# Patient Record
Sex: Male | Born: 1975 | Race: White | Hispanic: No | Marital: Married | Smoking: Current every day smoker
Health system: Southern US, Community
[De-identification: ages and names within clinical notes are randomized; demographics above are authoritative.]

## PROBLEM LIST (undated history)

## (undated) DIAGNOSIS — S060XAA Concussion with loss of consciousness status unknown, initial encounter: Secondary | ICD-10-CM

## (undated) DIAGNOSIS — S060X9A Concussion with loss of consciousness of unspecified duration, initial encounter: Secondary | ICD-10-CM

## (undated) HISTORY — DX: Concussion with loss of consciousness status unknown, initial encounter: S06.0XAA

## (undated) HISTORY — DX: Concussion with loss of consciousness of unspecified duration, initial encounter: S06.0X9A

---

## 2008-06-21 ENCOUNTER — Emergency Department: Payer: Self-pay | Admitting: Internal Medicine

## 2012-11-15 ENCOUNTER — Emergency Department: Payer: Self-pay | Admitting: Emergency Medicine

## 2014-06-16 ENCOUNTER — Emergency Department: Payer: Self-pay | Admitting: Emergency Medicine

## 2015-04-09 ENCOUNTER — Encounter: Payer: Self-pay | Admitting: Emergency Medicine

## 2015-04-09 ENCOUNTER — Emergency Department: Payer: 59

## 2015-04-09 ENCOUNTER — Other Ambulatory Visit: Payer: Self-pay

## 2015-04-09 ENCOUNTER — Emergency Department
Admission: EM | Admit: 2015-04-09 | Discharge: 2015-04-09 | Disposition: A | Discharge status: Home / Self Care | Payer: 59 | Attending: Emergency Medicine | Admitting: Emergency Medicine

## 2015-04-09 DIAGNOSIS — R51 Headache: Secondary | ICD-10-CM | POA: Insufficient documentation

## 2015-04-09 DIAGNOSIS — K219 Gastro-esophageal reflux disease without esophagitis: Secondary | ICD-10-CM | POA: Diagnosis not present

## 2015-04-09 DIAGNOSIS — R42 Dizziness and giddiness: Secondary | ICD-10-CM | POA: Diagnosis not present

## 2015-04-09 DIAGNOSIS — Z720 Tobacco use: Secondary | ICD-10-CM | POA: Insufficient documentation

## 2015-04-09 DIAGNOSIS — R519 Headache, unspecified: Secondary | ICD-10-CM

## 2015-04-09 LAB — COMPREHENSIVE METABOLIC PANEL
ALBUMIN: 4.3 g/dL (ref 3.5–5.0)
ALT: 23 U/L (ref 17–63)
ANION GAP: 10 (ref 5–15)
AST: 20 U/L (ref 15–41)
Alkaline Phosphatase: 74 U/L (ref 38–126)
BUN: 18 mg/dL (ref 6–20)
CALCIUM: 9.5 mg/dL (ref 8.9–10.3)
CO2: 27 mmol/L (ref 22–32)
CREATININE: 0.85 mg/dL (ref 0.61–1.24)
Chloride: 105 mmol/L (ref 101–111)
GFR calc Af Amer: >60 mL/min (ref >60)
GFR calc non Af Amer: >60 mL/min (ref >60)
Glucose, Bld: 98 mg/dL (ref 65–99)
Potassium: 3.7 mmol/L (ref 3.5–5.1)
Sodium: 142 mmol/L (ref 135–145)
TOTAL PROTEIN: 7.5 g/dL (ref 6.5–8.1)
Total Bilirubin: 0.6 mg/dL (ref 0.3–1.2)

## 2015-04-09 LAB — CBC WITH DIFFERENTIAL/PLATELET
BASOS ABS: 0.1 10*3/uL (ref 0–0.1)
Basophils Relative: 1 %
Eosinophils Absolute: 0.5 10*3/uL (ref 0–0.7)
Eosinophils Relative: 6 %
HCT: 45.3 % (ref 40.0–52.0)
Hemoglobin: 16 g/dL (ref 13.0–18.0)
LYMPHS PCT: 22 %
Lymphs Abs: 1.8 10*3/uL (ref 1.0–3.6)
MCH: 31.6 pg (ref 26.0–34.0)
MCHC: 35.4 g/dL (ref 32.0–36.0)
MCV: 89.5 fL (ref 80.0–100.0)
Monocytes Absolute: 0.5 10*3/uL (ref 0.2–1.0)
Monocytes Relative: 6 %
NEUTROS PCT: 65 %
Neutro Abs: 5.3 10*3/uL (ref 1.4–6.5)
PLATELETS: 190 10*3/uL (ref 150–440)
RBC: 5.07 MIL/uL (ref 4.40–5.90)
RDW: 12.2 % (ref 11.5–14.5)
WBC: 8.2 10*3/uL (ref 3.8–10.6)

## 2015-04-09 LAB — URINALYSIS COMPLETE WITH MICROSCOPIC (ARMC ONLY)
BACTERIA UA: NONE SEEN
Bilirubin Urine: NEGATIVE
GLUCOSE, UA: NEGATIVE mg/dL
HGB URINE DIPSTICK: NEGATIVE
KETONES UR: NEGATIVE mg/dL
Leukocytes, UA: NEGATIVE
NITRITE: NEGATIVE
Protein, ur: NEGATIVE mg/dL
Specific Gravity, Urine: 1.027 (ref 1.005–1.030)
pH: 6 (ref 5.0–8.0)

## 2015-04-09 LAB — TROPONIN I: Troponin I: <0.03 ng/mL (ref <0.031)

## 2015-04-09 MED ORDER — ONDANSETRON 4 MG PO TBDP: 4.0000 mg | ORAL_TABLET | Freq: Once | ORAL | Status: DC

## 2015-04-09 MED ORDER — BUTALBITAL-APAP-CAFFEINE 50-325-40 MG PO TABS: 1.0000 | ORAL_TABLET | Freq: Four times a day (QID) | ORAL | Status: DC | PRN

## 2015-04-09 MED ORDER — ONDANSETRON HCL 4 MG PO TABS: 4.0000 mg | ORAL_TABLET | Freq: Three times a day (TID) | ORAL | Status: DC | PRN

## 2015-04-09 NOTE — Discharge Instructions (Signed)
General Headache Without Cause A headache is pain or discomfort felt around the head or neck area. The specific cause of a headache may not be found. There are many causes and types of headaches. A few common ones are:  Tension headaches.  Migraine headaches.  Cluster headaches.  Chronic daily headaches. HOME CARE INSTRUCTIONS   Keep all follow-up appointments with your caregiver or any specialist referral.  Only take over-the-counter or prescription medicines for pain or discomfort as directed by your caregiver.  Lie down in a dark, quiet room when you have a headache.  Keep a headache journal to find out what may trigger your migraine headaches. For example, write down:  What you eat and drink.  How much sleep you get.  Any change to your diet or medicines.  Try massage or other relaxation techniques.  Put ice packs or heat on the head and neck. Use these 3 to 4 times per day for 15 to 20 minutes each time, or as needed.  Limit stress.  Sit up straight, and do not tense your muscles.  Quit smoking if you smoke.  Limit alcohol use.  Decrease the amount of caffeine you drink, or stop drinking caffeine.  Eat and sleep on a regular schedule.  Get 7 to 9 hours of sleep, or as recommended by your caregiver.  Keep lights dim if bright lights bother you and make your headaches worse. SEEK MEDICAL CARE IF:   You have problems with the medicines you were prescribed.  Your medicines are not working.  You have a change from the usual headache.  You have nausea or vomiting. SEEK IMMEDIATE MEDICAL CARE IF:   Your headache becomes severe.  You have a fever.  You have a stiff neck.  You have loss of vision.  You have muscular weakness or loss of muscle control.  You start losing your balance or have trouble walking.  You feel faint or pass out.  You have severe symptoms that are different from your first symptoms. MAKE SURE YOU:   Understand these  instructions.  Will watch your condition.  Will get help right away if you are not doing well or get worse. Document Released: 11/08/2005 Document Revised: 01/31/2012 Document Reviewed: 11/24/2011 [facility] Patient Information 2015 [place], [place]. This information is not intended to replace advice given to you by your health care provider. Make sure you discuss any questions you have with your health care provider.  Food Choices for Gastroesophageal Reflux Disease When you have gastroesophageal reflux disease (GERD), the foods you eat and your eating habits are very important. Choosing the right foods can help ease the discomfort of GERD. WHAT GENERAL GUIDELINES DO I NEED TO FOLLOW?  Choose fruits, vegetables, whole grains, low-fat dairy products, and low-fat meat, fish, and poultry.  Limit fats such as oils, salad dressings, butter, nuts, and avocado.  Keep a food diary to identify foods that cause symptoms.  Avoid foods that cause reflux. These may be different for different people.  Eat frequent small meals instead of three large meals each day.  Eat your meals slowly, in a relaxed setting.  Limit fried foods.  Cook foods using methods other than frying.  Avoid drinking alcohol.  Avoid drinking large amounts of liquids with your meals.  Avoid bending over or lying down until 2-3 hours after eating. WHAT FOODS ARE NOT RECOMMENDED? The following are some foods and drinks that may worsen your symptoms: Vegetables Tomatoes. Tomato juice. Tomato and spaghetti sauce. Chili peppers.  Onion and garlic. Horseradish. Fruits Oranges, grapefruit, and lemon (fruit and juice). Meats High-fat meats, fish, and poultry. This includes hot dogs, ribs, ham, sausage, salami, and bacon. Dairy Whole milk and chocolate milk. Sour cream. Cream. Butter. Ice cream. Cream cheese.  Beverages Coffee and tea, with or without caffeine. Carbonated beverages or energy drinks. Condiments Hot sauce.  Barbecue sauce.  Sweets/Desserts Chocolate and cocoa. Donuts. Peppermint and spearmint. Fats and Oils High-fat foods, including [place] fries and potato chips. Other Vinegar. Strong spices, such as black pepper, white pepper, red pepper, cayenne, curry powder, cloves, ginger, and chili powder. The items listed above may not be a complete list of foods and beverages to avoid. Contact your dietitian for more information. Document Released: 11/08/2005 Document Revised: 11/13/2013 Document Reviewed: 09/12/2013 [facility] Patient Information 2015 [place], [place]. This information is not intended to replace advice given to you by your health care provider. Make sure you discuss any questions you have with your health care provider.

## 2015-04-09 NOTE — ED Notes (Addendum)
Pt presents to ED with sudden onset of headache and vomiting. Pt states he had fallen asleep on the cough and when he was woken up his head started to hurt. Denies similar symptoms previously. Vomiting X3. Pt currently has clear speech and ambulates with a steady gait. No increased work of breathing or acute distress noted at this time. dx with flu approx 2 weeks ago but had recovered.

## 2015-04-09 NOTE — ED Provider Notes (Signed)
[facility] Emergency Department Provider Note  ____________________________________________  Time seen: Approximately 0545 AM  I have reviewed the triage vital signs and the nursing notes.   HISTORY  Chief Complaint Headache; Emesis; and Dizziness    HPI Rick Mendez is a 39 y.o. male who presents with sudden onset of dull, aching global headache approximately 11 PM. Patient states he had fallen asleep on the couch and woke up to move to his bed when he experienced a 10/10 global headache associated with vomiting 3. Patient denies dizziness, numbness, slurred speech, vision changes, neck pain. Patient denies tick exposure. Patient does say he had flu-like symptoms approximately 2 weeks ago but had recovered last week. Denies recent fever/chills.Attempted to take ibuprofen at home; however, vomited the medicine. States laying supine made his headache worse.   History reviewed. No pertinent past medical history.  Denies history of migraines/frequent headaches  There are no active problems to display for this patient.   History reviewed. No pertinent past surgical history.  Current Outpatient Rx  Name  Route  Sig  Dispense  Refill  . butalbital-acetaminophen-caffeine (FIORICET) 50-325-40 MG per tablet   Oral   Take 1 tablet by mouth every 6 (six) hours as needed for headache.   20 tablet   0   . ondansetron (ZOFRAN) 4 MG tablet   Oral   Take 1 tablet (4 mg total) by mouth every 8 (eight) hours as needed for nausea or vomiting.   20 tablet   1     Allergies Review of patient's allergies indicates no known allergies.  No family history on file.  No history of migraines No history of cerebral aneurysm History of CAD in male members less than 38 years old  Social History History  Substance Use Topics  . Smoking status: Current Every Day Smoker -- 1.00 packs/day    Types: Cigarettes  . Smokeless tobacco: Not on file  . Alcohol Use: No     Review of Systems Constitutional: No fever/chills Eyes: No visual changes. ENT: No sore throat. Cardiovascular: Denies chest pain. Respiratory: Denies shortness of breath. Gastrointestinal: No abdominal pain.  No nausea, no vomiting.  No diarrhea.  No constipation. Complains of almost nightly reflux symptoms radiating into right neck and jaw x several weeks. Genitourinary: Negative for dysuria. Musculoskeletal: Negative for back pain. Skin: Negative for rash. Neurological: Positive for headache. Negative for focal weakness or numbness.  10-point ROS otherwise negative.  ____________________________________________   PHYSICAL EXAM:  VITAL SIGNS: ED Triage Vitals  Enc Vitals Group     BP 04/09/15 0053 123/81 mmHg     Pulse Rate 04/09/15 0053 76     Resp 04/09/15 0620 16     Temp 04/09/15 0053 97.7 F (36.5 C)     Temp Source 04/09/15 0053 Oral     SpO2 04/09/15 0053 97 %     Weight 04/09/15 0053 197 lb (89.359 kg)     Height 04/09/15 0053 5\' 10"  (1.778 m)     Head Cir --      Peak Flow --      Pain Score 04/09/15 0101 4     Pain Loc --      Pain Edu? --      Excl. in GC? --     Constitutional: Alert and oriented. Well appearing and in no acute distress. Eyes: Conjunctivae are normal. PERRL. EOMI. Funduscopy within normal limits. Head: Atraumatic. Nose: No congestion/rhinnorhea. Mouth/Throat: Mucous membranes are moist.  Oropharynx  slightly erythematous with postnasal drip. No tonsillar swelling noted. Neck: No stridor.  No carotid bruit. Supple neck without signs of meningismus. Hematological/Lymphatic/Immunilogical: Shotty right anterior cervical lymphadenopathy. Cardiovascular: Normal rate, regular rhythm. Grossly normal heart sounds.  Good peripheral circulation. Respiratory: Normal respiratory effort.  No retractions. Lungs CTAB. Gastrointestinal: Soft and nontender. No distention. No abdominal bruits. No CVA tenderness. Musculoskeletal: No lower extremity  tenderness nor edema.  No joint effusions. Neurologic:  Normal speech and language. No gross focal neurologic deficits are appreciated. Speech is normal. No gait instability. Skin:  Skin is warm, dry and intact. No rash noted. No petechiae. Psychiatric: Mood and affect are normal. Speech and behavior are normal.  ____________________________________________   LABS (all labs ordered are listed, but only abnormal results are displayed)  Labs Reviewed  URINALYSIS COMPLETEWITH MICROSCOPIC ([facility])  - Abnormal; Notable for the following:    Color, Urine YELLOW (*)    APPearance CLEAR (*)    Squamous Epithelial / LPF 0-5 (*)    All other components within normal limits  CBC WITH DIFFERENTIAL/PLATELET  COMPREHENSIVE METABOLIC PANEL  TROPONIN I   ____________________________________________  EKG  ED ECG REPORT   Date: 04/09/2015  EKG Time: 0557  Rate: 60  Rhythm: normal EKG, normal sinus rhythm  Axis: Normal  Intervals:none  ST&T Change: Nonspecific  ____________________________________________  RADIOLOGY  CT head without contrast interpreted per Dr. Cherly Hensenhang:  Unremarkable noncontrast CT of the head. ____________________________________________   PROCEDURES  Procedure(s) performed: None  Critical Care performed: No  ____________________________________________   INITIAL IMPRESSION / ASSESSMENT AND PLAN / ED COURSE  Pertinent labs & imaging results that were available during my care of the patient were reviewed by me and considered in my medical decision making (see chart for details).  39 year old male presents with sudden onset of global headache prior to arrival. Headache resolved without intervention while patient was in waiting room. Currently voices no complaints. Supple neck; no focal neurologic deficits noted. Discussed with patient and family low suspicion of subarachnoid hemorrhage given that headache resolved without intervention and patient is resting  comfortably without neurologic deficits with supple neck. Spouse concerned symptoms of headache and several week history of reflux-like symptoms may be cardiac in nature, especially given patient's strong family history of CAD. Will obtain EKG and check troponin.  0630: Patient resting in no acute distress. Headache has not recurred. No complaints of reflux, chest pain, jaw pain symptoms. Will refer to cardiology for further workup. PRN Fioricet and Zofran; also close follow-up with PCP. Discussed with patient and spouse and given strict return precautions. Both verbalize understanding and agree with plan of care. ____________________________________________   FINAL CLINICAL IMPRESSION(S) / ED DIAGNOSES  Final diagnoses:  Acute nonintractable headache, unspecified headache type  Gastroesophageal reflux disease without esophagitis      Irean HongJade J Danitza Schoenfeldt, MD 04/09/15 [phone]

## 2015-08-20 ENCOUNTER — Telehealth: Payer: Self-pay | Admitting: Unknown Physician Specialty

## 2015-08-20 MED ORDER — TADALAFIL 20 MG PO TABS: 20.0000 mg | ORAL_TABLET | Freq: Every day | ORAL | Status: DC | PRN

## 2015-08-20 NOTE — Telephone Encounter (Signed)
Patient was last seen 03/26/15, practice partner number is [phone], and pharmacy is [facility] on [address].

## 2015-08-20 NOTE — Telephone Encounter (Signed)
Pt would like a refill for cialis to go to [facility]

## 2015-08-20 NOTE — Telephone Encounter (Signed)
Called to let patient know that Elnita Maxwell refilled his medication. But while Elnita Maxwell and I were looking in Garment/textile technologist, we noticed that the patient is due for his physical so I tried to call him to see if he wanted to schedule but he did not answer. I asked for him to please return my call to schedule a physical.

## 2015-08-21 NOTE — Telephone Encounter (Signed)
Patient returned call and scheduled CPE for 09/23/15.

## 2015-09-16 DIAGNOSIS — F17220 Nicotine dependence, chewing tobacco, uncomplicated: Secondary | ICD-10-CM | POA: Insufficient documentation

## 2015-09-16 DIAGNOSIS — N529 Male erectile dysfunction, unspecified: Secondary | ICD-10-CM

## 2015-09-16 DIAGNOSIS — F17200 Nicotine dependence, unspecified, uncomplicated: Secondary | ICD-10-CM

## 2015-09-23 ENCOUNTER — Encounter: Payer: Self-pay | Admitting: Unknown Physician Specialty

## 2015-10-01 ENCOUNTER — Ambulatory Visit (INDEPENDENT_AMBULATORY_CARE_PROVIDER_SITE_OTHER): Payer: 59 | Admitting: Unknown Physician Specialty

## 2015-10-01 ENCOUNTER — Encounter: Payer: Self-pay | Admitting: Unknown Physician Specialty

## 2015-10-01 VITALS — BP 139/85 | HR 79 | Temp 98.4°F | Ht 69.1 in | Wt 199.8 lb

## 2015-10-01 DIAGNOSIS — K219 Gastro-esophageal reflux disease without esophagitis: Secondary | ICD-10-CM | POA: Insufficient documentation

## 2015-10-01 DIAGNOSIS — Z0000 Encounter for general adult medical examination without abnormal findings: Secondary | ICD-10-CM

## 2015-10-01 DIAGNOSIS — F17200 Nicotine dependence, unspecified, uncomplicated: Secondary | ICD-10-CM

## 2015-10-01 MED ORDER — OMEPRAZOLE 20 MG PO CPDR: 20.0000 mg | DELAYED_RELEASE_CAPSULE | Freq: Every day | ORAL | Status: DC

## 2015-10-01 NOTE — Patient Instructions (Signed)

## 2015-10-01 NOTE — Progress Notes (Signed)
   BP 139/85 mmHg  Pulse 79  Temp(Src) 98.4 F (36.9 C)  Ht 5' 9.1" (1.755 m)  Wt 199 lb 12.8 oz (90.629 kg)  BMI 29.42 kg/m2  SpO2 99%   Subjective:    Patient ID: Rick Mendez, male    DOB: [birth year], 39 y.o.   MRN: [id number]  HPI: Rick Hartiganubrey L Guardia is a 39 y.o. male  Chief Complaint  Patient presents with  . Annual Exam   GERD Burning sensation in chest with anything he eats.  Taking Zantac which makes it better.  States everything bothers it and bothers hem at night.  States he woke up with it the other day.  He was in the ER for chest pain in May with a negative cardiac work up.    Tobacco He understands the contribution to GERD.  Not ready to quit at this time.    Relevant past medical, surgical, family and social history reviewed and updated as indicated. Interim medical history since our last visit reviewed. Allergies and medications reviewed and updated.  Review of Systems  Constitutional: Negative.   HENT: Negative.   Eyes: Negative.   Respiratory: Negative.   Cardiovascular: Negative.   Gastrointestinal: Positive for nausea and abdominal pain.  Endocrine: Negative.   Genitourinary: Negative.   Skin: Negative.   Allergic/Immunologic: Negative.   Neurological: Negative.   Hematological: Negative.   Psychiatric/Behavioral: Negative.     Per HPI unless specifically indicated above     Objective:    BP 139/85 mmHg  Pulse 79  Temp(Src) 98.4 F (36.9 C)  Ht 5' 9.1" (1.755 m)  Wt 199 lb 12.8 oz (90.629 kg)  BMI 29.42 kg/m2  SpO2 99%  Wt Readings from Last 3 Encounters:  10/01/15 199 lb 12.8 oz (90.629 kg)  03/26/15 201 lb (91.173 kg)  04/09/15 197 lb (89.359 kg)    Physical Exam  Constitutional: He is oriented to person, place, and time. He appears well-developed and well-nourished.  HENT:  Head: Normocephalic.  Eyes: Pupils are equal, round, and reactive to light.  Cardiovascular: Normal rate, regular rhythm and normal heart sounds.    Pulmonary/Chest: Effort normal.  Abdominal: Soft. Bowel sounds are normal.  Musculoskeletal: Normal range of motion.  Neurological: He is alert and oriented to person, place, and time. He has normal reflexes.  Skin: Skin is warm and dry.  Psychiatric: He has a normal mood and affect. His behavior is normal. Judgment and thought content normal.      Assessment & Plan:   Problem List Items Addressed This Visit      Unprioritized   Tobacco use disorder    Encouraged to quit      GERD (gastroesophageal reflux disease)    Start Omeprazole.  Risks discussed      Relevant Medications   omeprazole (PRILOSEC) 20 MG capsule    Other Visit Diagnoses    Annual physical exam    -  Primary        Follow up plan: No Follow-up on file.

## 2015-10-01 NOTE — Assessment & Plan Note (Signed)
Start Omeprazole.  Risks discussed

## 2015-10-01 NOTE — Assessment & Plan Note (Signed)
Encouraged to quit. 

## 2015-10-02 ENCOUNTER — Encounter: Payer: Self-pay | Admitting: Unknown Physician Specialty

## 2015-10-02 LAB — COMPREHENSIVE METABOLIC PANEL
ALT: 21 IU/L (ref 0–44)
AST: 21 IU/L (ref 0–40)
Albumin/Globulin Ratio: 1.8 (ref 1.1–2.5)
Albumin: 4.8 g/dL (ref 3.5–5.5)
Alkaline Phosphatase: 88 IU/L (ref 39–117)
BUN/Creatinine Ratio: 22 — ABNORMAL HIGH (ref 8–19)
BUN: 19 mg/dL (ref 6–20)
Bilirubin Total: 0.3 mg/dL (ref 0.0–1.2)
CALCIUM: 9.8 mg/dL (ref 8.7–10.2)
CO2: 23 mmol/L (ref 18–29)
CREATININE: 0.86 mg/dL (ref 0.76–1.27)
Chloride: 98 mmol/L (ref 97–106)
GFR calc Af Amer: 128 mL/min/{1.73_m2} (ref >59)
GFR, EST NON AFRICAN AMERICAN: 111 mL/min/{1.73_m2} (ref >59)
Globulin, Total: 2.6 g/dL (ref 1.5–4.5)
Glucose: 75 mg/dL (ref 65–99)
Potassium: 4.2 mmol/L (ref 3.5–5.2)
Sodium: 139 mmol/L (ref 136–144)
Total Protein: 7.4 g/dL (ref 6.0–8.5)

## 2015-10-02 LAB — CBC
Hematocrit: 45.1 % (ref 37.5–51.0)
Hemoglobin: 15.9 g/dL (ref 12.6–17.7)
MCH: 32.4 pg (ref 26.6–33.0)
MCHC: 35.3 g/dL (ref 31.5–35.7)
MCV: 92 fL (ref 79–97)
PLATELETS: 195 10*3/uL (ref 150–379)
RBC: 4.9 x10E6/uL (ref 4.14–5.80)
RDW: 12.8 % (ref 12.3–15.4)
WBC: 7 10*3/uL (ref 3.4–10.8)

## 2015-10-02 LAB — HIV ANTIBODY (ROUTINE TESTING W REFLEX): HIV Screen 4th Generation wRfx: NONREACTIVE

## 2015-10-02 LAB — LIPID PANEL W/O CHOL/HDL RATIO
Cholesterol, Total: 168 mg/dL (ref 100–199)
HDL: 32 mg/dL — ABNORMAL LOW (ref >39)
LDL CALC: 110 mg/dL — AB (ref 0–99)
Triglycerides: 129 mg/dL (ref 0–149)
VLDL CHOLESTEROL CAL: 26 mg/dL (ref 5–40)

## 2015-10-02 LAB — PSA: PROSTATE SPECIFIC AG, SERUM: 0.7 ng/mL (ref 0.0–4.0)

## 2015-10-02 LAB — TSH: TSH: 2.23 u[IU]/mL (ref 0.450–4.500)

## 2015-10-10 ENCOUNTER — Emergency Department: Payer: 59

## 2015-10-10 ENCOUNTER — Encounter: Payer: Self-pay | Admitting: Emergency Medicine

## 2015-10-10 ENCOUNTER — Emergency Department
Admission: EM | Admit: 2015-10-10 | Discharge: 2015-10-10 | Disposition: A | Discharge status: Home / Self Care | Payer: 59 | Attending: Emergency Medicine | Admitting: Emergency Medicine

## 2015-10-10 DIAGNOSIS — S93402A Sprain of unspecified ligament of left ankle, initial encounter: Secondary | ICD-10-CM | POA: Diagnosis not present

## 2015-10-10 DIAGNOSIS — F17210 Nicotine dependence, cigarettes, uncomplicated: Secondary | ICD-10-CM | POA: Diagnosis not present

## 2015-10-10 DIAGNOSIS — Y9301 Activity, walking, marching and hiking: Secondary | ICD-10-CM | POA: Diagnosis not present

## 2015-10-10 DIAGNOSIS — Y998 Other external cause status: Secondary | ICD-10-CM | POA: Diagnosis not present

## 2015-10-10 DIAGNOSIS — Z79899 Other long term (current) drug therapy: Secondary | ICD-10-CM | POA: Diagnosis not present

## 2015-10-10 DIAGNOSIS — Y9289 Other specified places as the place of occurrence of the external cause: Secondary | ICD-10-CM | POA: Insufficient documentation

## 2015-10-10 DIAGNOSIS — X58XXXA Exposure to other specified factors, initial encounter: Secondary | ICD-10-CM | POA: Diagnosis not present

## 2015-10-10 DIAGNOSIS — S99912A Unspecified injury of left ankle, initial encounter: Secondary | ICD-10-CM | POA: Diagnosis present

## 2015-10-10 MED ORDER — TRAMADOL HCL 50 MG PO TABS: 50.0000 mg | ORAL_TABLET | Freq: Four times a day (QID) | ORAL | Status: DC | PRN

## 2015-10-10 MED ORDER — IBUPROFEN 800 MG PO TABS: 800.0000 mg | ORAL_TABLET | Freq: Three times a day (TID) | ORAL | Status: DC | PRN

## 2015-10-10 NOTE — Discharge Instructions (Signed)
Ankle Sprain °An ankle sprain is an injury to the strong, fibrous tissues (ligaments) that hold the bones of your ankle joint together.  °CAUSES °An ankle sprain is usually caused by a fall or by twisting your ankle. Ankle sprains most commonly occur when you step on the outer edge of your foot, and your ankle turns inward. People who participate in sports are more prone to these types of injuries.  °SYMPTOMS  °· Pain in your ankle. The pain may be present at rest or only when you are trying to stand or walk. °· Swelling. °· Bruising. Bruising may develop immediately or within 1 to 2 days after your injury. °· Difficulty standing or walking, particularly when turning corners or changing directions. °DIAGNOSIS  °Your caregiver will ask you details about your injury and perform a physical exam of your ankle to determine if you have an ankle sprain. During the physical exam, your caregiver will press on and apply pressure to specific areas of your foot and ankle. Your caregiver will try to move your ankle in certain ways. An X-ray exam may be done to be sure a bone was not broken or a ligament did not separate from one of the bones in your ankle (avulsion fracture).  °TREATMENT  °Certain types of braces can help stabilize your ankle. Your caregiver can make a recommendation for this. Your caregiver may recommend the use of medicine for pain. If your sprain is severe, your caregiver may refer you to a surgeon who helps to restore function to parts of your skeletal system (orthopedist) or a physical therapist. °HOME CARE INSTRUCTIONS  °· Apply ice to your injury for 1-2 days or as directed by your caregiver. Applying ice helps to reduce inflammation and pain. °· Put ice in a plastic bag. °· Place a towel between your skin and the bag. °· Leave the ice on for 15-20 minutes at a time, every 2 hours while you are awake. °· Only take over-the-counter or prescription medicines for pain, discomfort, or fever as directed by  your caregiver. °· Elevate your injured ankle above the level of your heart as much as possible for 2-3 days. °· If your caregiver recommends crutches, use them as instructed. Gradually put weight on the affected ankle. Continue to use crutches or a cane until you can walk without feeling pain in your ankle. °· If you have a plaster splint, wear the splint as directed by your caregiver. Do not rest it on anything harder than a pillow for the first 24 hours. Do not put weight on it. Do not get it wet. You may take it off to take a shower or bath. °· You may have been given an elastic bandage to wear around your ankle to provide support. If the elastic bandage is too tight (you have numbness or tingling in your foot or your foot becomes cold and blue), adjust the bandage to make it comfortable. °· If you have an air splint, you may blow more air into it or let air out to make it more comfortable. You may take your splint off at night and before taking a shower or bath. Wiggle your toes in the splint several times per day to decrease swelling. °SEEK MEDICAL CARE IF:  °· You have rapidly increasing bruising or swelling. °· Your toes feel extremely cold or you lose feeling in your foot. °· Your pain is not relieved with medicine. °SEEK IMMEDIATE MEDICAL CARE IF: °· Your toes are numb or blue. °·   You have severe pain that is increasing. °MAKE SURE YOU:  °· Understand these instructions. °· Will watch your condition. °· Will get help right away if you are not doing well or get worse. °  °This information is not intended to replace advice given to you by your health care provider. Make sure you discuss any questions you have with your health care provider. °  °Document Released: 11/08/2005 Document Revised: 11/29/2014 Document Reviewed: 11/20/2011 °Elsevier Interactive Patient Education ©2016 [employer]. ° °Cryotherapy °Cryotherapy means treatment with cold. Ice or gel packs can be used to reduce both pain and swelling.  Ice is the most helpful within the first 24 to 48 hours after an injury or flare-up from overusing a muscle or joint. Sprains, strains, spasms, burning pain, shooting pain, and aches can all be eased with ice. Ice can also be used when recovering from surgery. Ice is effective, has very few side effects, and is safe for most people to use. °PRECAUTIONS  °Ice is not a safe treatment option for people with: °· Raynaud phenomenon. This is a condition affecting small blood vessels in the extremities. Exposure to cold may cause your problems to return. °· Cold hypersensitivity. There are many forms of cold hypersensitivity, including: °¨ Cold urticaria. Red, itchy hives appear on the skin when the tissues begin to warm after being iced. °¨ Cold erythema. This is a red, itchy rash caused by exposure to cold. °¨ Cold hemoglobinuria. Red blood cells break down when the tissues begin to warm after being iced. The hemoglobin that carry oxygen are passed into the urine because they cannot combine with blood proteins fast enough. °· Numbness or altered sensitivity in the area being iced. °If you have any of the following conditions, do not use ice until you have discussed cryotherapy with your caregiver: °· Heart conditions, such as arrhythmia, angina, or chronic heart disease. °· High blood pressure. °· Healing wounds or open skin in the area being iced. °· Current infections. °· Rheumatoid arthritis. °· Poor circulation. °· Diabetes. °Ice slows the blood flow in the region it is applied. This is beneficial when trying to stop inflamed tissues from spreading irritating chemicals to surrounding tissues. However, if you expose your skin to cold temperatures for too long or without the proper protection, you can damage your skin or nerves. Watch for signs of skin damage due to cold. °HOME CARE INSTRUCTIONS °Follow these tips to use ice and cold packs safely. °· Place a dry or damp towel between the ice and skin. A damp towel will  cool the skin more quickly, so you may need to shorten the time that the ice is used. °· For a more rapid response, add gentle compression to the ice. °· Ice for no more than 10 to 20 minutes at a time. The bonier the area you are icing, the less time it will take to get the benefits of ice. °· Check your skin after 5 minutes to make sure there are no signs of a poor response to cold or skin damage. °· Rest 20 minutes or more between uses. °· Once your skin is numb, you can end your treatment. You can test numbness by very lightly touching your skin. The touch should be so light that you do not see the skin dimple from the pressure of your fingertip. When using ice, most people will feel these normal sensations in this order: cold, burning, aching, and numbness. °· Do not use ice on someone who   cannot communicate their responses to pain, such as small children or people with dementia. °HOW TO MAKE AN ICE PACK °Ice packs are the most common way to use ice therapy. Other methods include ice massage, ice baths, and cryosprays. Muscle creams that cause a cold, tingly feeling do not offer the same benefits that ice offers and should not be used as a substitute unless recommended by your caregiver. °To make an ice pack, do one of the following: °· Place crushed ice or a bag of frozen vegetables in a sealable plastic bag. Squeeze out the excess air. Place this bag inside another plastic bag. Slide the bag into a pillowcase or place a damp towel between your skin and the bag. °· Mix 3 parts water with 1 part rubbing alcohol. Freeze the mixture in a sealable plastic bag. When you remove the mixture from the freezer, it will be slushy. Squeeze out the excess air. Place this bag inside another plastic bag. Slide the bag into a pillowcase or place a damp towel between your skin and the bag. °SEEK MEDICAL CARE IF: °· You develop white spots on your skin. This may give the skin a blotchy (mottled) appearance. °· Your skin turns  blue or pale. °· Your skin becomes waxy or hard. °· Your swelling gets worse. °MAKE SURE YOU:  °· Understand these instructions. °· Will watch your condition. °· Will get help right away if you are not doing well or get worse. °  °This information is not intended to replace advice given to you by your health care provider. Make sure you discuss any questions you have with your health care provider. °  °Document Released: 07/05/2011 Document Revised: 11/29/2014 Document Reviewed: 07/05/2011 °Elsevier Interactive Patient Education ©2016 [employer]. ° °

## 2015-10-10 NOTE — ED Provider Notes (Signed)
CSN: [id number]     Arrival date & time 10/10/15  2058 History   First MD Initiated Contact with Patient 10/10/15 2123     Chief Complaint  Patient presents with  . Ankle Pain    left     (Consider location/radiation/quality/duration/timing/severity/associated sxs/prior Treatment) HPI  39 year old male presents to emergency department for evaluation of left ankle pain. Patient was walking on a curb just prior to arrival when he inverted and rolled his left ankle. Patient has pain swelling throughout the left lateral ankle. There is no medial ankle pain. He felt a pop along the lateral ankle. He has had 800 mg of ibuprofen. His pain is mild. He describes the pain as a constant throbbing. He is unable to bear weight. He denies any other injuries to his body.  Past Medical History  Diagnosis Date  . Concussion    History reviewed. No pertinent past surgical history. Family History  Problem Relation Age of Onset  . Diabetes Father   . Mental illness Father   . Dementia Maternal Grandmother   . Heart disease Maternal Grandfather     MI  . Cancer Paternal Grandfather     prostate   Social History  Substance Use Topics  . Smoking status: Current Every Day Smoker -- 1.50 packs/day for 19 years    Types: Cigarettes  . Smokeless tobacco: Never Used  . Alcohol Use: Yes     Comment: social drinker    Review of Systems  Constitutional: Negative.   Cardiovascular: Negative for chest pain and leg swelling.  Gastrointestinal: Negative for abdominal pain.  Musculoskeletal: Positive for joint swelling and gait problem. Negative for back pain and neck pain.  Skin: Negative for color change, rash and wound.  Neurological: Negative for dizziness, syncope and weakness.  Psychiatric/Behavioral: Negative for hallucinations and confusion.  All other systems reviewed and are negative.     Allergies  Review of patient's allergies indicates no known allergies.  Home Medications   Prior  to Admission medications   Medication Sig Start Date End Date Taking? Authorizing Provider  ibuprofen (ADVIL,MOTRIN) 800 MG tablet Take 1 tablet (800 mg total) by mouth every 8 (eight) hours as needed. 10/10/15   Evon Slack, PA-C  omeprazole (PRILOSEC) 20 MG capsule Take 1 capsule (20 mg total) by mouth daily. 10/01/15   Gabriel Cirri, NP  tadalafil (CIALIS) 20 MG tablet Take 1 tablet (20 mg total) by mouth daily as needed for erectile dysfunction. 08/20/15   Gabriel Cirri, NP  traMADol (ULTRAM) 50 MG tablet Take 1 tablet (50 mg total) by mouth every 6 (six) hours as needed. 10/10/15   Evon Slack, PA-C   BP 129/70 mmHg  Pulse 114  Temp(Src) 98.1 F (36.7 C) (Oral)  Resp 18  Ht  (1.803 m)  Wt 195 lb (88.451 kg)  BMI 27.21 kg/m2  SpO2 95% Physical Exam  Constitutional: He is oriented to person, place, and time. He appears well-developed and well-nourished.  HENT:  Head: Normocephalic and atraumatic.  Eyes: Conjunctivae and EOM are normal. Pupils are equal, round, and reactive to light.  Neck: Normal range of motion. Neck supple.  Cardiovascular: Normal rate and intact distal pulses.   Pulmonary/Chest: Effort normal. No respiratory distress.  Musculoskeletal:  Examination of the left lower strip shows patient has full range of motion of the hip and knee. He has lived range of motion of the ankle. There is significant lateral ankle soft tissue swelling. He is tender over  the ATFL ligament. No fifth metatarsal discomfort. No medial malleolus tenderness. No gross deformity. His neurovascularly intact in left lower extremity.  Neurological: He is alert and oriented to person, place, and time.  Skin: Skin is warm and dry.  Psychiatric: He has a normal mood and affect. His behavior is normal. Judgment and thought content normal.    ED Course  Procedures (including critical care time) SPLINT APPLICATION Date/Time: 9:44 PM Authorized by: Patience MuscaGAINES, Avyonna Wagoner CHRISTOPHER Consent:  Verbal consent obtained. Risks and benefits: risks, benefits and alternatives were discussed Consent given by: patient Splint applied [id number] Location details: left ankle Splint type: ankle stirrup Supplies used: ankle stirrup Post-procedure: The splinted body part was neurovascularly unchanged following the procedure. Patient tolerance: Patient tolerated the procedure well with no immediate complications.    Labs Review Labs Reviewed - No data to display  Imaging Review No results found. I have personally reviewed and evaluated these images and lab results as part of my medical decision-making. AP lateral and oblique views of the left ankle were ordered and interpreted by me in the office today. Impression: Ankle mortise intact, there is no evidence of acute bony abnormality. There is visible soft tissue swelling.   EKG Interpretation None      MDM   Final diagnoses:  Ankle sprain, right, initial encounter    39 year old male with left lateral ankle sprain of the ATFL ligament. X-rays negative for any acute bony abnormality. He is placed into a ankle splint. He will rest ice and elevate. He is given crutches to help with ambulation. Ibuprofen when necessary pain. Follow-up with orthopedics if no improvement in 5-7 days.    Evon Slackhomas C Nalu Troublefield, PA-C 10/10/15 2142  Evon Slackhomas C Kongmeng Santoro, PA-C 10/10/15 2144  Governor Rooksebecca Lord, MD 10/11/15 [phone]

## 2015-10-10 NOTE — ED Notes (Signed)
Pt was discharged by Penni BombardKendall RN

## 2015-10-10 NOTE — ED Notes (Signed)
Patient states that he stepped off a curb and twisted his left ankle. Patient states that he heard cracking. Patient with swelling to left ankle.

## 2016-02-09 IMAGING — CT CT HEAD W/O CM
1 series · 16 of 30 positions shown, 20 images · non-contrast
Comparison: CT of the head performed 06/17/2014

CLINICAL DATA: Acute onset of headache and vomiting. Initial
encounter.

EXAM:
CT HEAD WITHOUT CONTRAST
TECHNIQUE: Contiguous axial images were obtained from the base of the skull
through the vertex without intravenous contrast.

[Series 2: head wo · axial · 0.41mm/px · z∈[-126,+18]mm · 16 of 36 slices shown, 20 images]
[im 2/36  brain]
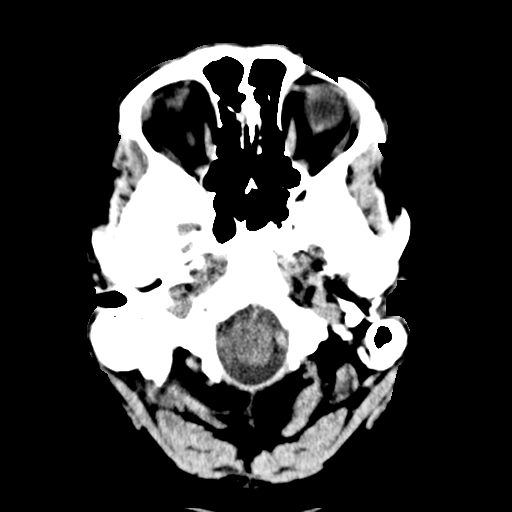
[im 2/36  bone]
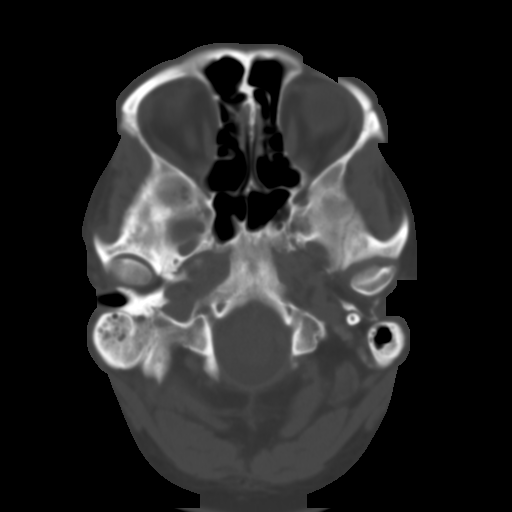
[im 4/36  brain]
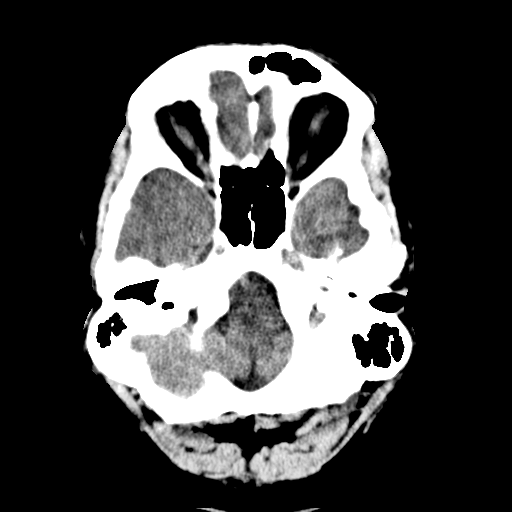
[im 7/36  brain]
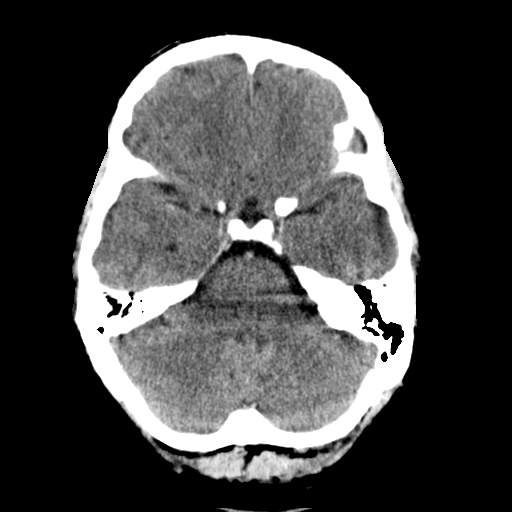
[im 9/36  brain]
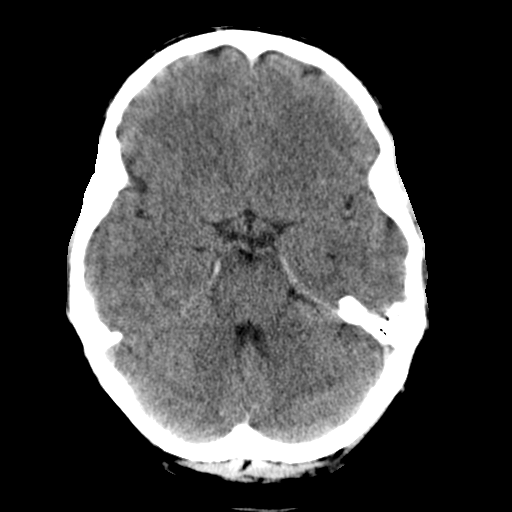
[im 10/36  brain]
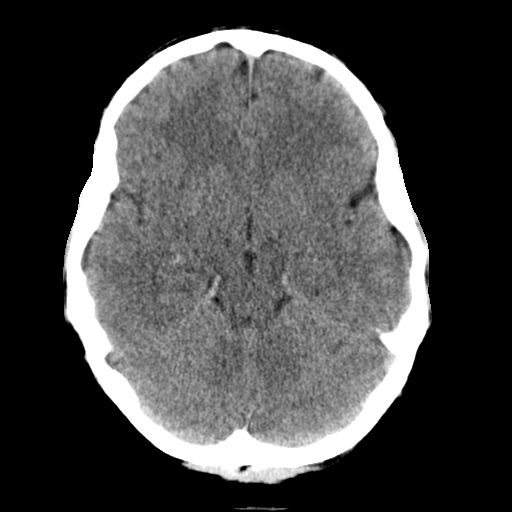
[im 10/36  bone]
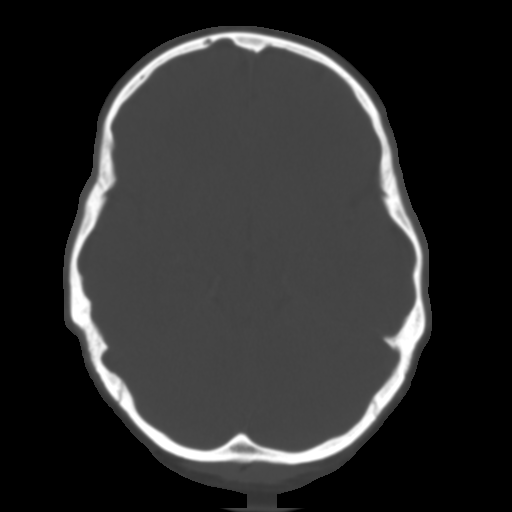
[im 13/36  brain]
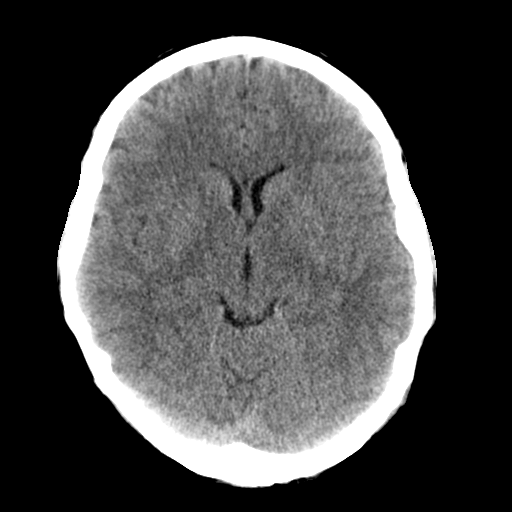
[im 15/36  brain]
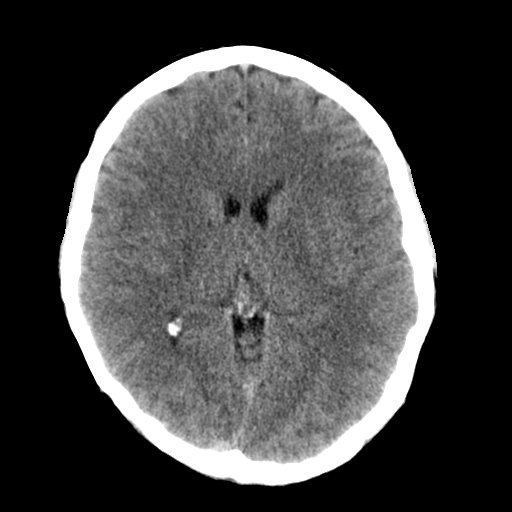
[im 17/36  brain]
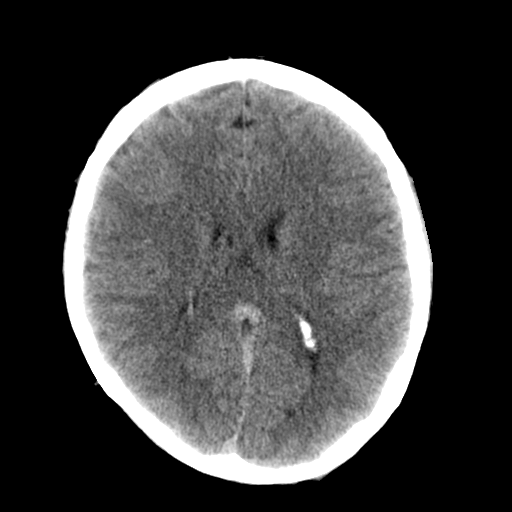
[im 19/36  brain]
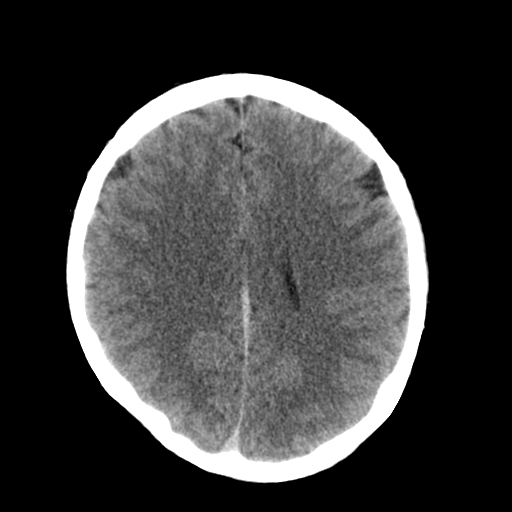
[im 19/36  bone]
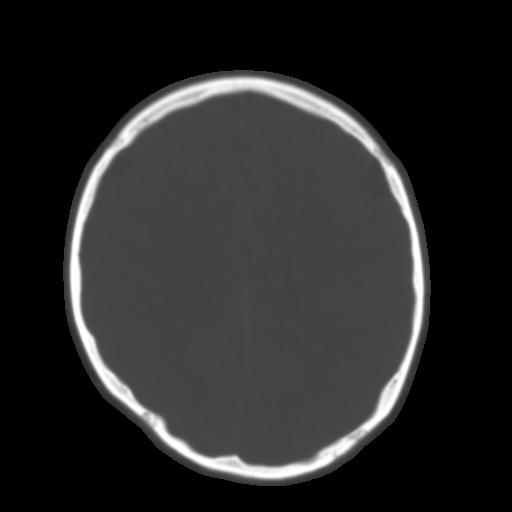
[im 21/36  brain]
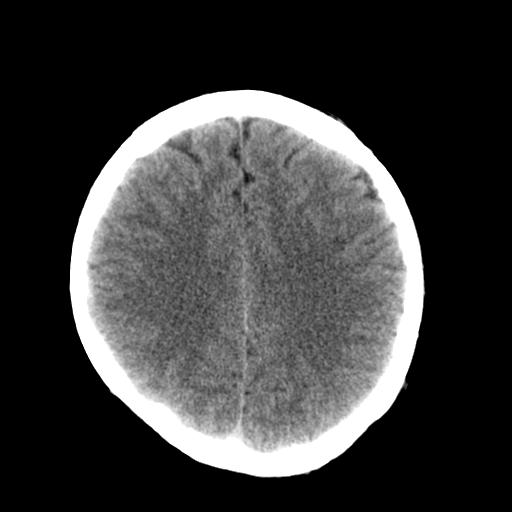
[im 23/36  brain]
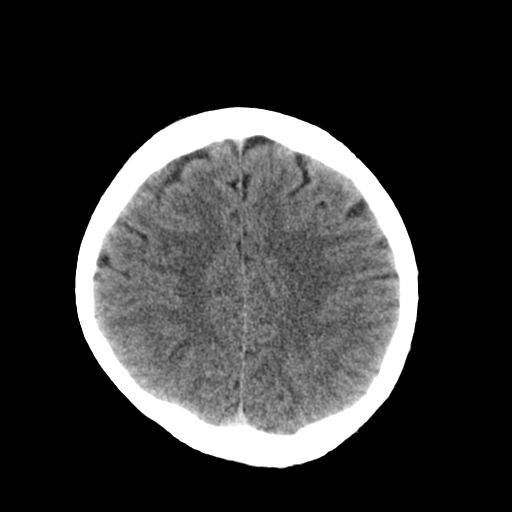
[im 26/36  brain]
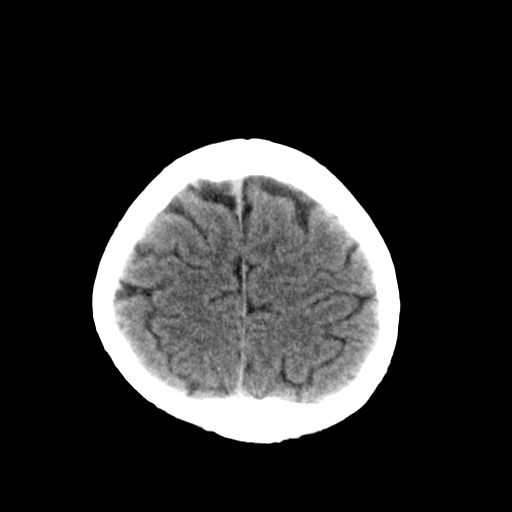
[im 27/36  brain]
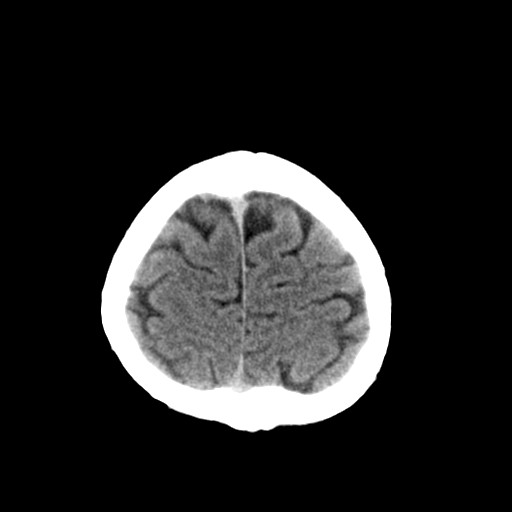
[im 27/36  bone]
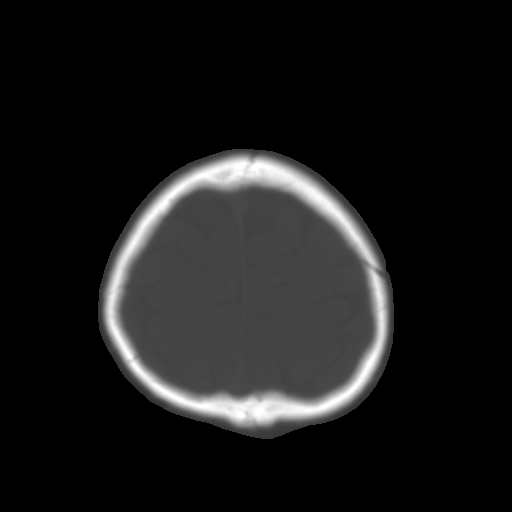
[im 29/36  brain]
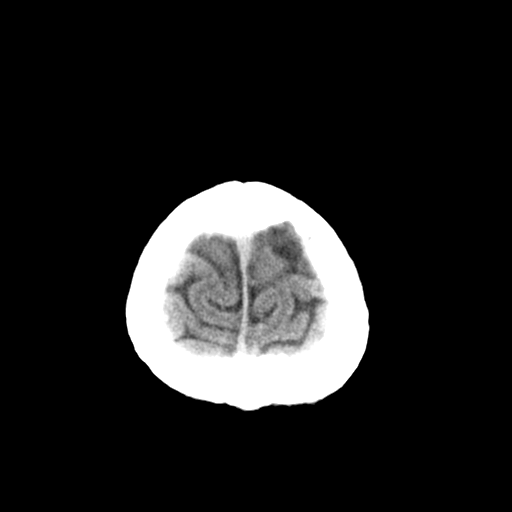
[im 32/36  brain]
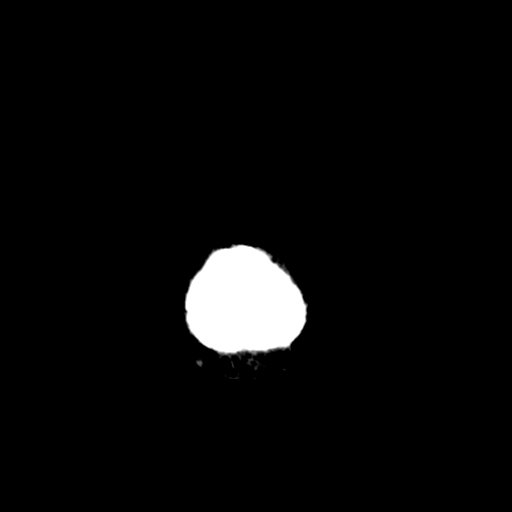
[im 34/36  brain]
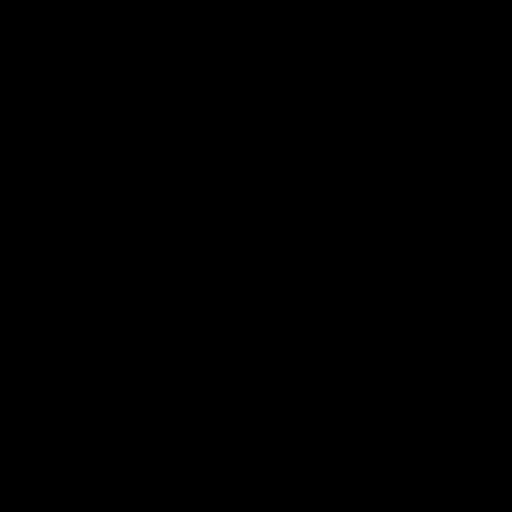

[16 of 30 positions shown; findings below may reference images not displayed]

FINDINGS: There is no evidence of acute infarction, mass lesion, or intra- or
extra-axial hemorrhage on CT.

The posterior fossa, including the cerebellum, brainstem and fourth
ventricle, is within normal limits. The third and lateral
ventricles, and basal ganglia are unremarkable in appearance. The
cerebral hemispheres are symmetric in appearance, with normal
gray-white differentiation. No mass effect or midline shift is seen.

There is no evidence of fracture; visualized osseous structures are
unremarkable in appearance. The visualized portions of the orbits
are within normal limits. The paranasal sinuses and mastoid air
cells are well-aerated. No significant soft tissue abnormalities are
seen.
IMPRESSION: Unremarkable noncontrast CT of the head.

## 2016-07-22 ENCOUNTER — Telehealth: Payer: Self-pay | Admitting: Unknown Physician Specialty

## 2016-07-22 NOTE — Telephone Encounter (Signed)
LMOM to call back to schedule an appt.

## 2016-07-22 NOTE — Telephone Encounter (Signed)
According to chart, this medication was given to patient in May of 2016 for a short amount of time. Would patient needed an appointment for more refills?

## 2016-07-22 NOTE — Telephone Encounter (Signed)
Pt stated he would call back to schedule an appt

## 2016-07-22 NOTE — Telephone Encounter (Signed)
Needs appt, thanks

## 2016-07-22 NOTE — Telephone Encounter (Signed)
Pt's wife called stated pt needs refill on RX that was prescribed to him for migraines during an ER visit. Medication is Fioricet. [facility] [place]. [facility]. Thanks.

## 2016-07-22 NOTE — Telephone Encounter (Signed)
Christan, will you schedule an appointment for this patient?

## 2016-08-11 IMAGING — CR DG ANKLE COMPLETE 3+V*L*
1 series · 3 of 3 positions shown · non-contrast
Comparison: None.

CLINICAL DATA: Patient stepped off curb this evening into a drain
and rolled his left ankle. His pain is on the lateral side of his
ankle with swelling.

EXAM:
LEFT ANKLE COMPLETE - 3+ VIEW

[Series 1: x ankle ap left · 0.14mm/px · 3 of 3 slices shown]
[im 1/3]
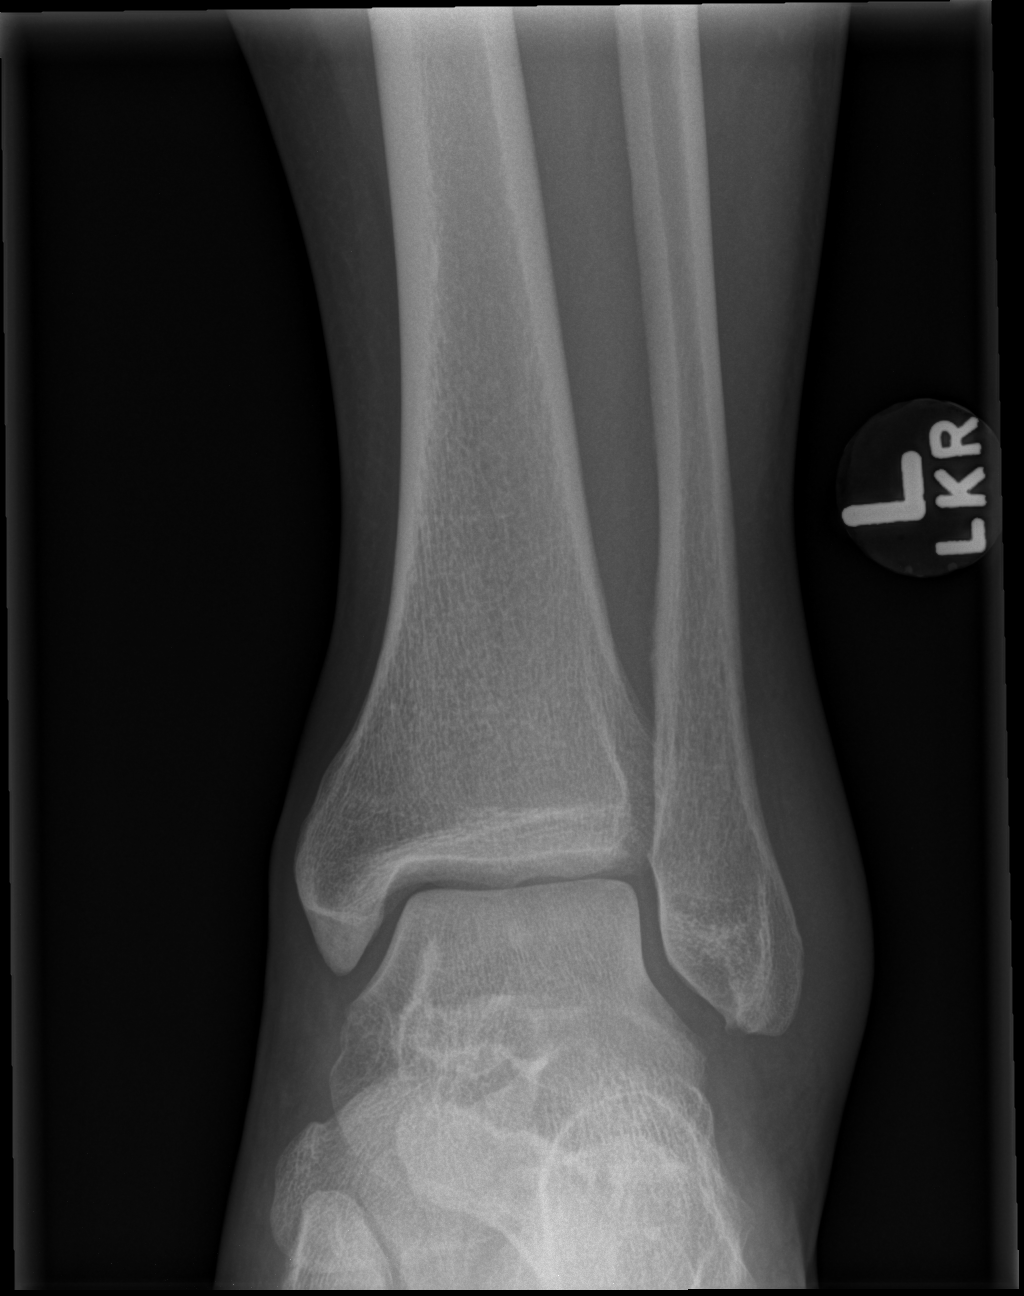
[im 2/3]
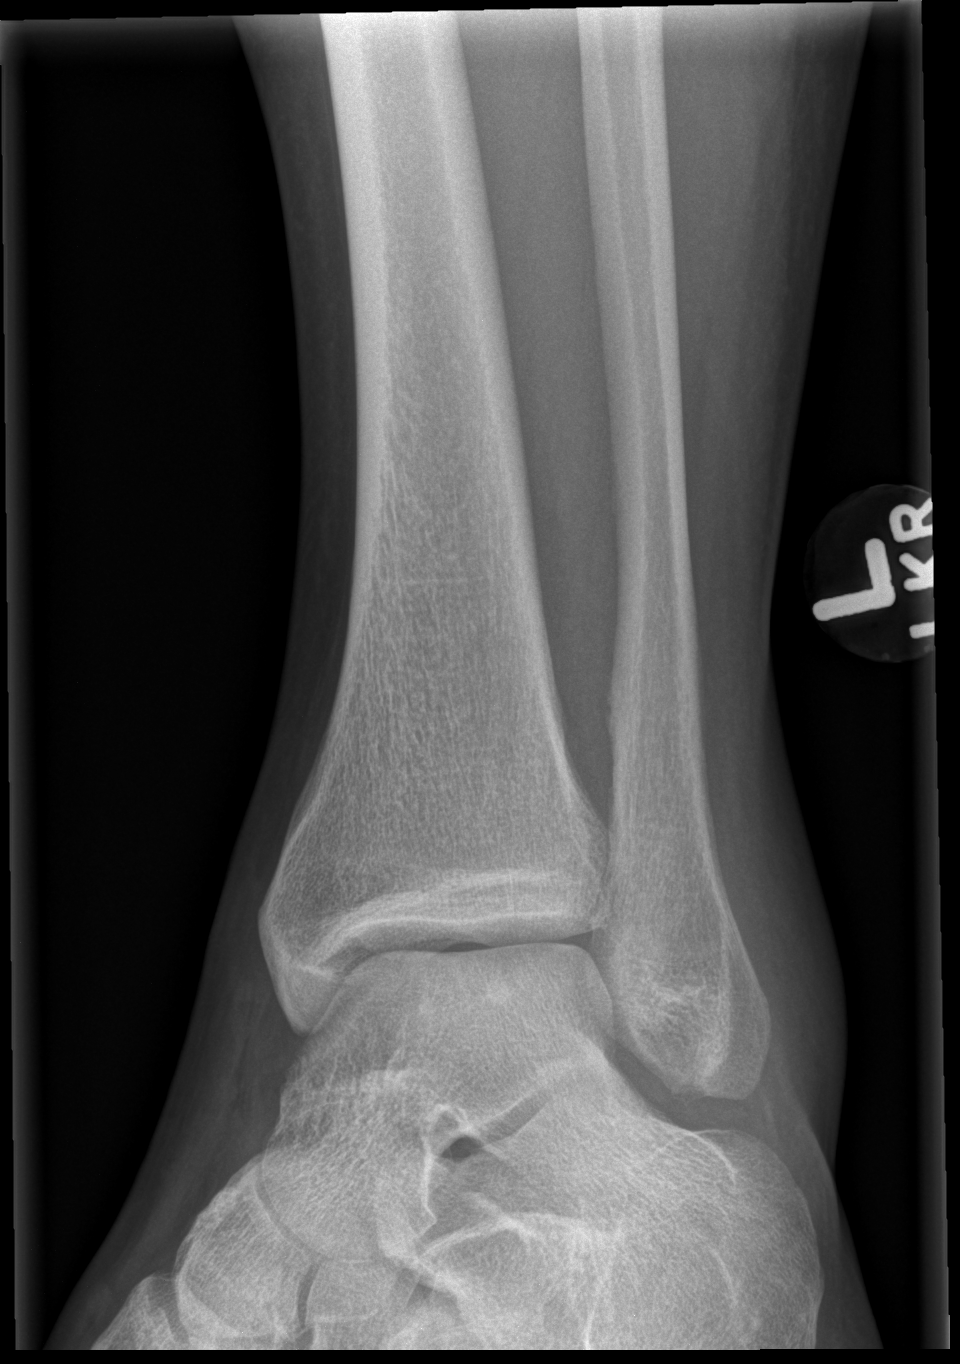
[im 3/3]
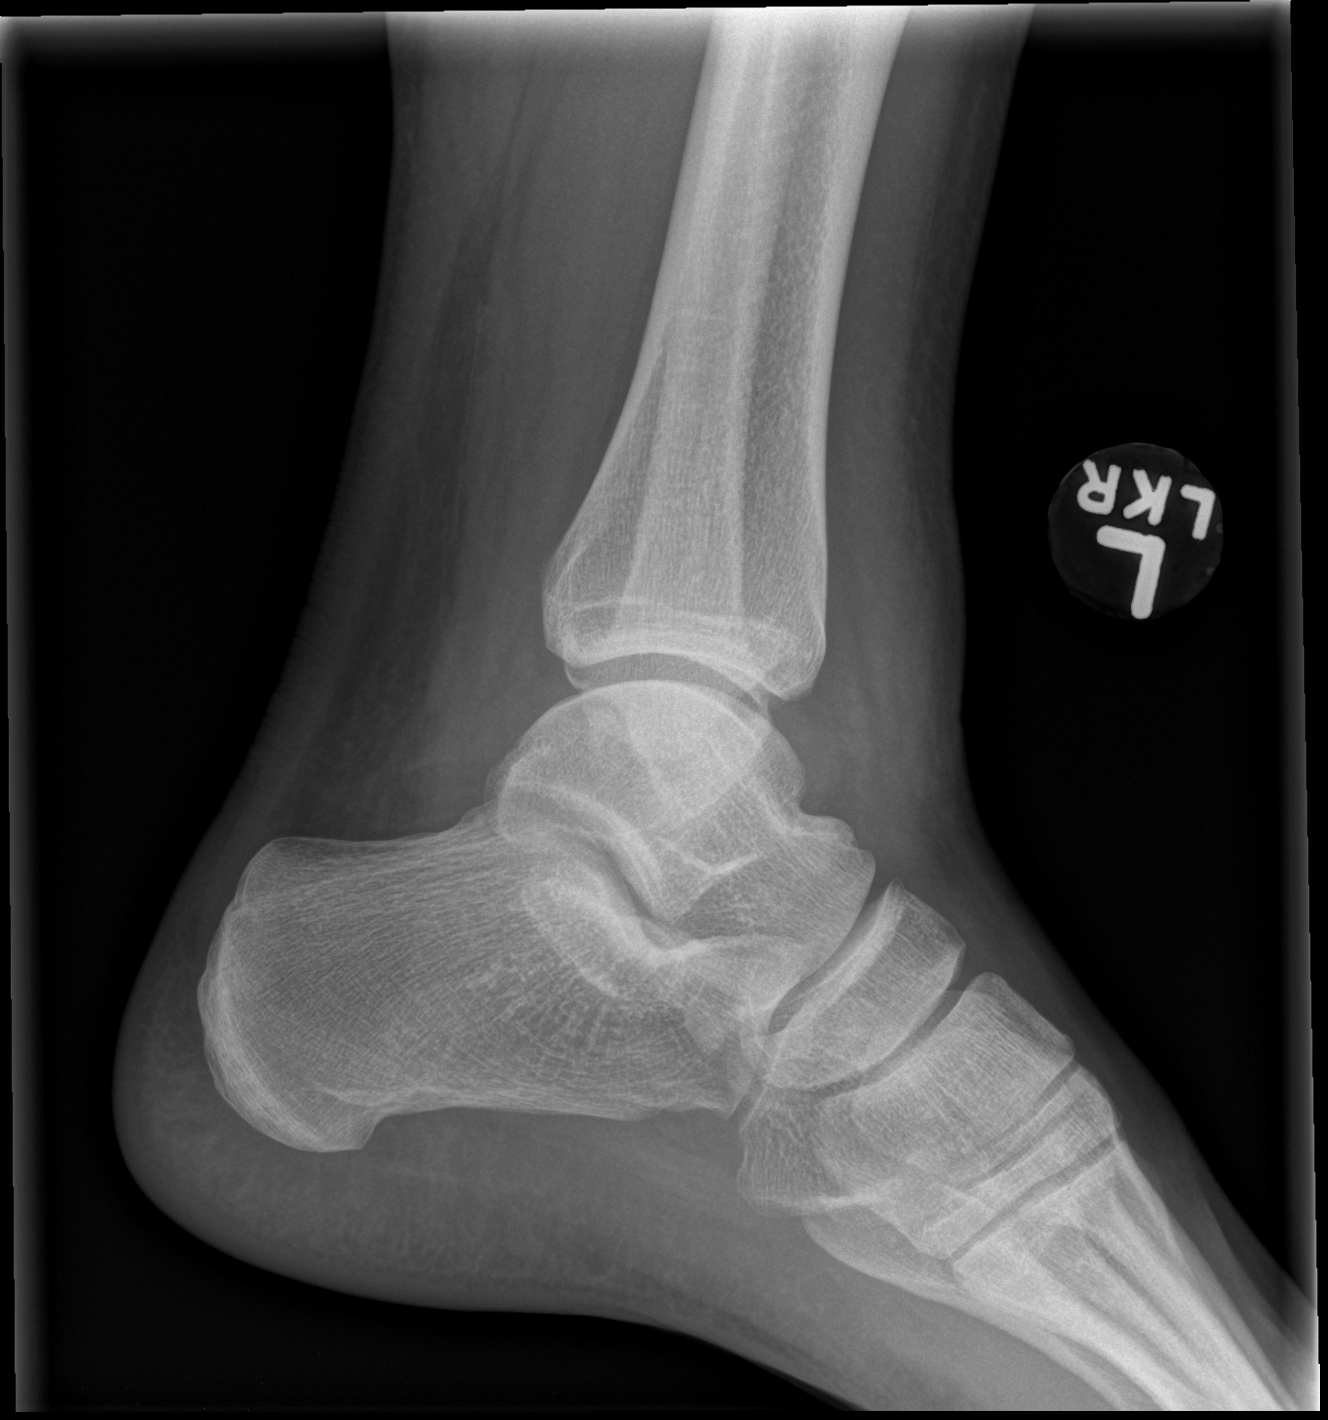

[3 of 3 positions shown; findings below may reference images not displayed]

FINDINGS: No fracture or dislocation. The alignment and joint spaces are
maintained. The ankle mortise is preserved. There is diffuse soft
tissue edema about the lateral ankle.
IMPRESSION: Lateral soft tissue edema without fracture or dislocation.

## 2016-11-10 ENCOUNTER — Other Ambulatory Visit: Payer: Self-pay | Admitting: Unknown Physician Specialty

## 2016-11-11 NOTE — Telephone Encounter (Signed)
Routing to provider  

## 2016-11-11 NOTE — Addendum Note (Signed)
Addended by: Bayard HuggerPHARR, Ola Raap N on: 11/11/2016 01:56 PM   Modules accepted: Orders

## 2016-11-11 NOTE — Telephone Encounter (Signed)
Pt has an appt scheduled for 12/29 and would like to know if his refill could be called in until his appt.

## 2016-11-12 MED ORDER — TADALAFIL 20 MG PO TABS: 20.0000 mg | ORAL_TABLET | Freq: Every day | ORAL | 0 refills | Status: DC | PRN

## 2016-11-19 ENCOUNTER — Encounter: Payer: Self-pay | Admitting: Unknown Physician Specialty

## 2016-11-19 ENCOUNTER — Ambulatory Visit (INDEPENDENT_AMBULATORY_CARE_PROVIDER_SITE_OTHER): Payer: 59 | Admitting: Unknown Physician Specialty

## 2016-11-19 VITALS — BP 118/82 | HR 84 | Temp 97.8°F | Ht 69.5 in | Wt 196.0 lb

## 2016-11-19 DIAGNOSIS — Z0000 Encounter for general adult medical examination without abnormal findings: Secondary | ICD-10-CM | POA: Diagnosis not present

## 2016-11-19 DIAGNOSIS — F17200 Nicotine dependence, unspecified, uncomplicated: Secondary | ICD-10-CM

## 2016-11-19 DIAGNOSIS — K219 Gastro-esophageal reflux disease without esophagitis: Secondary | ICD-10-CM

## 2016-11-19 MED ORDER — TADALAFIL 20 MG PO TABS: 20.0000 mg | ORAL_TABLET | Freq: Every day | ORAL | 12 refills | Status: DC | PRN

## 2016-11-19 MED ORDER — OMEPRAZOLE 20 MG PO CPDR: 20.0000 mg | DELAYED_RELEASE_CAPSULE | Freq: Every day | ORAL | 3 refills | Status: DC

## 2016-11-19 NOTE — Assessment & Plan Note (Signed)
Stable, continue present medications.   

## 2016-11-19 NOTE — Progress Notes (Signed)
BP 118/82   Pulse 84   Temp 97.8 F (36.6 C)   Ht 5' 9.5" (1.765 m)   Wt 196 lb (88.9 kg)   SpO2 98%   BMI 28.53 kg/m    Subjective:    Patient ID: Rick Hartiganubrey L Nims, male    DOB: October 21, 1976, 40 y.o.   MRN: [id number]  HPI: Rick Mendez is a 40 y.o. male  Chief Complaint  Patient presents with  . Annual Exam    No concerns today.    Relevant past medical, surgical, family and social history reviewed and updated as indicated. Interim medical history since our last visit reviewed. Allergies and medications reviewed and updated.  Social History   Social History  . Marital status: Married    Spouse name: N/A  . Number of children: N/A  . Years of education: N/A   Occupational History  . Not on file.   Social History Main Topics  . Smoking status: Current Every Day Smoker    Packs/day: 1.50    Years: 19.00    Types: Cigarettes  . Smokeless tobacco: Never Used  . Alcohol use Yes     Comment: social drinker  . Drug use: No  . Sexual activity: Yes   Other Topics Concern  . Not on file   Social History Narrative  . No narrative on file   Family History  Problem Relation Age of Onset  . Diabetes Father   . Mental illness Father   . Dementia Maternal Grandmother   . Heart disease Maternal Grandfather     MI  . Cancer Paternal Grandfather     prostate   Past Medical History:  Diagnosis Date  . Concussion    History reviewed. No pertinent surgical history.  Review of Systems  Constitutional: Negative.   HENT: Negative.   Eyes: Negative.   Respiratory: Negative.   Cardiovascular: Negative.   Gastrointestinal: Negative.   Endocrine: Negative.   Genitourinary: Negative.   Skin: Negative.   Allergic/Immunologic: Negative.   Neurological: Negative.   Hematological: Negative.   Psychiatric/Behavioral: Negative.     Per HPI unless specifically indicated above     Objective:    BP 118/82   Pulse 84   Temp 97.8 F (36.6 C)   Ht 5' 9.5"  (1.765 m)   Wt 196 lb (88.9 kg)   SpO2 98%   BMI 28.53 kg/m   Wt Readings from Last 3 Encounters:  11/19/16 196 lb (88.9 kg)  10/10/15 195 lb (88.5 kg)  10/01/15 199 lb 12.8 oz (90.6 kg)    Physical Exam  Constitutional: He is oriented to person, place, and time. He appears well-developed and well-nourished.  HENT:  Head: Normocephalic.  Right Ear: Tympanic membrane, external ear and ear canal normal.  Left Ear: Tympanic membrane, external ear and ear canal normal.  Mouth/Throat: Uvula is midline, oropharynx is clear and moist and mucous membranes are normal.  Eyes: Pupils are equal, round, and reactive to light.  Cardiovascular: Normal rate, regular rhythm and normal heart sounds.  Exam reveals no gallop and no friction rub.   No murmur heard. Pulmonary/Chest: Effort normal and breath sounds normal. No respiratory distress.  Abdominal: Soft. Bowel sounds are normal. He exhibits no distension. There is no tenderness.  Musculoskeletal: Normal range of motion.  Neurological: He is alert and oriented to person, place, and time. He has normal reflexes.  Skin: Skin is warm and dry.  Psychiatric: He has a normal mood and affect.  His behavior is normal. Judgment and thought content normal.    Results for orders placed or performed in visit on 10/01/15  CBC  Result Value Ref Range   WBC 7.0 3.4 - 10.8 x10E3/uL   RBC 4.90 4.14 - 5.80 x10E6/uL   Hemoglobin 15.9 12.6 - 17.7 g/dL   Hematocrit 16.145.1 09.637.5 - 51.0 %   MCV 92 79 - 97 fL   MCH 32.4 26.6 - 33.0 pg   MCHC 35.3 31.5 - 35.7 g/dL   RDW 04.512.8 40.912.3 - 81.115.4 %   Platelets 195 150 - 379 x10E3/uL  Comprehensive metabolic panel  Result Value Ref Range   Glucose 75 65 - 99 mg/dL   BUN 19 6 - 20 mg/dL   Creatinine, Ser 9.140.86 0.76 - 1.27 mg/dL   GFR calc non Af Amer 111 >59 mL/min/1.73   GFR calc Af Amer 128 >59 mL/min/1.73   BUN/Creatinine Ratio 22 (H) 8 - 19   Sodium 139 136 - 144 mmol/L   Potassium 4.2 3.5 - 5.2 mmol/L   Chloride 98  97 - 106 mmol/L   CO2 23 18 - 29 mmol/L   Calcium 9.8 8.7 - 10.2 mg/dL   Total Protein 7.4 6.0 - 8.5 g/dL   Albumin 4.8 3.5 - 5.5 g/dL   Globulin, Total 2.6 1.5 - 4.5 g/dL   Albumin/Globulin Ratio 1.8 1.1 - 2.5   Bilirubin Total 0.3 0.0 - 1.2 mg/dL   Alkaline Phosphatase 88 39 - 117 IU/L   AST 21 0 - 40 IU/L   ALT 21 0 - 44 IU/L  HIV antibody  Result Value Ref Range   HIV Screen 4th Generation wRfx Non Reactive Non Reactive  Lipid Panel w/o Chol/HDL Ratio  Result Value Ref Range   Cholesterol, Total 168 100 - 199 mg/dL   Triglycerides 782129 0 - 149 mg/dL   HDL 32 (L) >95>39 mg/dL   VLDL Cholesterol Cal 26 5 - 40 mg/dL   LDL Calculated 621110 (H) 0 - 99 mg/dL  PSA  Result Value Ref Range   Prostate Specific Ag, Serum 0.7 0.0 - 4.0 ng/mL  TSH  Result Value Ref Range   TSH 2.230 0.450 - 4.500 uIU/mL      Assessment & Plan:   Problem List Items Addressed This Visit      Unprioritized   GERD (gastroesophageal reflux disease)    Stable, continue present medications.        Relevant Medications   omeprazole (PRILOSEC) 20 MG capsule   Tobacco use disorder    Pt states quitting smoking is his New Years resolution       Other Visit Diagnoses    Annual physical exam    -  Primary   Relevant Orders   Comprehensive metabolic panel   CBC with Differential/Platelet   TSH   Lipid Panel w/o Chol/HDL Ratio       Follow up plan: Return in about 1 year (around 11/19/2017).

## 2016-11-19 NOTE — Assessment & Plan Note (Signed)
Pt states quitting smoking is his New Years resolution

## 2016-11-20 LAB — CBC WITH DIFFERENTIAL/PLATELET
BASOS ABS: 0 10*3/uL (ref 0.0–0.2)
Basos: 0 %
EOS (ABSOLUTE): 0.3 10*3/uL (ref 0.0–0.4)
EOS: 4 %
HEMATOCRIT: 45.4 % (ref 37.5–51.0)
HEMOGLOBIN: 15.9 g/dL (ref 13.0–17.7)
Immature Grans (Abs): 0 10*3/uL (ref 0.0–0.1)
Immature Granulocytes: 0 %
LYMPHS: 25 %
Lymphocytes Absolute: 1.9 10*3/uL (ref 0.7–3.1)
MCH: 31.4 pg (ref 26.6–33.0)
MCHC: 35 g/dL (ref 31.5–35.7)
MCV: 90 fL (ref 79–97)
MONOCYTES: 7 %
Monocytes Absolute: 0.6 10*3/uL (ref 0.1–0.9)
NEUTROS ABS: 4.8 10*3/uL (ref 1.4–7.0)
Neutrophils: 64 %
Platelets: 232 10*3/uL (ref 150–379)
RBC: 5.06 x10E6/uL (ref 4.14–5.80)
RDW: 13.1 % (ref 12.3–15.4)
WBC: 7.5 10*3/uL (ref 3.4–10.8)

## 2016-11-20 LAB — LIPID PANEL W/O CHOL/HDL RATIO
Cholesterol, Total: 140 mg/dL (ref 100–199)
HDL: 33 mg/dL — ABNORMAL LOW (ref >39)
LDL Calculated: 75 mg/dL (ref 0–99)
Triglycerides: 159 mg/dL — ABNORMAL HIGH (ref 0–149)
VLDL CHOLESTEROL CAL: 32 mg/dL (ref 5–40)

## 2016-11-20 LAB — COMPREHENSIVE METABOLIC PANEL
ALBUMIN: 4.5 g/dL (ref 3.5–5.5)
ALT: 20 IU/L (ref 0–44)
AST: 14 IU/L (ref 0–40)
Albumin/Globulin Ratio: 2 (ref 1.2–2.2)
Alkaline Phosphatase: 78 IU/L (ref 39–117)
BUN/Creatinine Ratio: 16 (ref 9–20)
BUN: 14 mg/dL (ref 6–20)
Bilirubin Total: 0.4 mg/dL (ref 0.0–1.2)
CALCIUM: 9.5 mg/dL (ref 8.7–10.2)
CO2: 27 mmol/L (ref 18–29)
CREATININE: 0.88 mg/dL (ref 0.76–1.27)
Chloride: 102 mmol/L (ref 96–106)
GFR calc Af Amer: 125 mL/min/{1.73_m2} (ref >59)
GFR, EST NON AFRICAN AMERICAN: 108 mL/min/{1.73_m2} (ref >59)
Globulin, Total: 2.2 g/dL (ref 1.5–4.5)
Glucose: 64 mg/dL — ABNORMAL LOW (ref 65–99)
Potassium: 4.4 mmol/L (ref 3.5–5.2)
Sodium: 142 mmol/L (ref 134–144)
Total Protein: 6.7 g/dL (ref 6.0–8.5)

## 2016-11-20 LAB — TSH: TSH: 1.25 u[IU]/mL (ref 0.450–4.500)

## 2016-11-23 ENCOUNTER — Encounter: Payer: Self-pay | Admitting: Unknown Physician Specialty

## 2017-10-19 ENCOUNTER — Encounter: Payer: Self-pay | Admitting: Unknown Physician Specialty

## 2017-10-19 ENCOUNTER — Ambulatory Visit (INDEPENDENT_AMBULATORY_CARE_PROVIDER_SITE_OTHER): Payer: 59 | Admitting: Unknown Physician Specialty

## 2017-10-19 VITALS — BP 124/85 | HR 85 | Temp 98.3°F | Ht 69.7 in | Wt 206.5 lb

## 2017-10-19 DIAGNOSIS — F419 Anxiety disorder, unspecified: Secondary | ICD-10-CM | POA: Diagnosis not present

## 2017-10-19 DIAGNOSIS — K219 Gastro-esophageal reflux disease without esophagitis: Secondary | ICD-10-CM

## 2017-10-19 DIAGNOSIS — N529 Male erectile dysfunction, unspecified: Secondary | ICD-10-CM | POA: Diagnosis not present

## 2017-10-19 DIAGNOSIS — Z0000 Encounter for general adult medical examination without abnormal findings: Secondary | ICD-10-CM

## 2017-10-19 MED ORDER — BUSPIRONE HCL 5 MG PO TABS: 5.0000 mg | ORAL_TABLET | Freq: Three times a day (TID) | ORAL | 1 refills | Status: DC

## 2017-10-19 MED ORDER — SILDENAFIL CITRATE 20 MG PO TABS: ORAL_TABLET | ORAL | 2 refills | Status: DC

## 2017-10-19 MED ORDER — OMEPRAZOLE 20 MG PO CPDR: 20.0000 mg | DELAYED_RELEASE_CAPSULE | Freq: Every day | ORAL | 3 refills | Status: DC
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
## 2017-10-19 NOTE — Assessment & Plan Note (Signed)
New problem. Worried about SSRI sexual side-effects.  Start Buspar, titrate up to 5mg  TID.  Recheck in 4 weeks.  Discussed counseling and exercise

## 2017-10-19 NOTE — Patient Instructions (Signed)
Zantac/Ranitidine

## 2017-10-19 NOTE — Assessment & Plan Note (Signed)
Generic Sildenafil.  Discussed off label.

## 2017-10-19 NOTE — Assessment & Plan Note (Signed)
Rx for Omeprazole 

## 2017-10-20 ENCOUNTER — Telehealth: Payer: Self-pay | Admitting: Unknown Physician Specialty

## 2017-10-20 ENCOUNTER — Encounter: Payer: Self-pay | Admitting: Unknown Physician Specialty

## 2017-10-20 LAB — CBC WITH DIFFERENTIAL/PLATELET
BASOS: 0 %
Basophils Absolute: 0 10*3/uL (ref 0.0–0.2)
EOS (ABSOLUTE): 0.4 10*3/uL (ref 0.0–0.4)
Eos: 5 %
HEMOGLOBIN: 16.1 g/dL (ref 13.0–17.7)
Hematocrit: 47 % (ref 37.5–51.0)
IMMATURE GRANS (ABS): 0 10*3/uL (ref 0.0–0.1)
IMMATURE GRANULOCYTES: 0 %
LYMPHS: 28 %
Lymphocytes Absolute: 1.9 10*3/uL (ref 0.7–3.1)
MCH: 32.2 pg (ref 26.6–33.0)
MCHC: 34.3 g/dL (ref 31.5–35.7)
MCV: 94 fL (ref 79–97)
MONOCYTES: 5 %
Monocytes Absolute: 0.4 10*3/uL (ref 0.1–0.9)
NEUTROS PCT: 62 %
Neutrophils Absolute: 4.3 10*3/uL (ref 1.4–7.0)
Platelets: 203 10*3/uL (ref 150–379)
RBC: 5 x10E6/uL (ref 4.14–5.80)
RDW: 13.3 % (ref 12.3–15.4)
WBC: 7 10*3/uL (ref 3.4–10.8)

## 2017-10-20 LAB — COMPREHENSIVE METABOLIC PANEL
ALBUMIN: 4.5 g/dL (ref 3.5–5.5)
ALT: 21 IU/L (ref 0–44)
AST: 17 IU/L (ref 0–40)
Albumin/Globulin Ratio: 1.8 (ref 1.2–2.2)
Alkaline Phosphatase: 85 IU/L (ref 39–117)
BILIRUBIN TOTAL: 0.4 mg/dL (ref 0.0–1.2)
BUN / CREAT RATIO: 16 (ref 9–20)
BUN: 15 mg/dL (ref 6–24)
CALCIUM: 9.6 mg/dL (ref 8.7–10.2)
CHLORIDE: 106 mmol/L (ref 96–106)
CO2: 24 mmol/L (ref 20–29)
Creatinine, Ser: 0.92 mg/dL (ref 0.76–1.27)
GFR, EST AFRICAN AMERICAN: 120 mL/min/{1.73_m2} (ref >59)
GFR, EST NON AFRICAN AMERICAN: 104 mL/min/{1.73_m2} (ref >59)
GLUCOSE: 75 mg/dL (ref 65–99)
Globulin, Total: 2.5 g/dL (ref 1.5–4.5)
Potassium: 5.2 mmol/L (ref 3.5–5.2)
Sodium: 144 mmol/L (ref 134–144)
TOTAL PROTEIN: 7 g/dL (ref 6.0–8.5)

## 2017-10-20 LAB — LIPID PANEL W/O CHOL/HDL RATIO
Cholesterol, Total: 154 mg/dL (ref 100–199)
HDL: 41 mg/dL (ref >39)
LDL CALC: 102 mg/dL — AB (ref 0–99)
Triglycerides: 57 mg/dL (ref 0–149)
VLDL CHOLESTEROL CAL: 11 mg/dL (ref 5–40)

## 2017-10-20 LAB — TSH: TSH: 2.07 u[IU]/mL (ref 0.450–4.500)

## 2017-10-20 NOTE — Telephone Encounter (Signed)
See note; Buspirone on back order permanently

## 2017-10-20 NOTE — Telephone Encounter (Signed)
Copied from CRM [id number]. Topic: Quick Communication - Rx Refill/Question >> Oct 20, 2017  9:31 AM Leafy Roobinson, Norma J wrote: Has the patient contacted their pharmacy? yes  Pt is calling the buspirone is on back order permanently   Preferred Pharmacy (with phone number or street name): [facility] [phone]    Agent: Please be advised that RX refills may take up to 48 hours. We ask that you follow-up with your pharmacy.

## 2017-10-21 NOTE — Telephone Encounter (Signed)
Called pharmacy to find out about medication. [facility] and [facility] states that she believes that the patient had his prescription transferred to a different pharmacy.Will call patient to find out.

## 2017-10-21 NOTE — Telephone Encounter (Signed)
Can we check on what doses are on back order?

## 2017-10-21 NOTE — Telephone Encounter (Signed)
Pt called stating that he was able to get prescription from [facility] on [address] [place]; will route to [facility] to notify them of this encounter

## 2017-10-21 NOTE — Telephone Encounter (Signed)
Called and left patient a VM asking for him to please return my call regarding medication. OK for [facility] to speak with patient to find out if he had his prescription transferred and was able to pick it up.

## 2017-11-21 ENCOUNTER — Ambulatory Visit: Payer: 59 | Admitting: Unknown Physician Specialty

## 2017-12-08 ENCOUNTER — Other Ambulatory Visit: Payer: Self-pay | Admitting: Unknown Physician Specialty

## 2017-12-08 NOTE — Telephone Encounter (Signed)
Last OV 10/19/17 with Rick Mendez.   He was to have a 4 week follow up.   Looks like he was a no show on 11/21/17.  I don't see where he has rescheduled for a follow up.  Buspar refill

## 2017-12-15 ENCOUNTER — Other Ambulatory Visit: Payer: Self-pay | Admitting: Unknown Physician Specialty

## 2017-12-15 NOTE — Telephone Encounter (Signed)
See request to refill Buspirone 5 mg tab; last office visit was 10/19/17; was started on Buspirone at that visit, and was to f/u in 4 weeks, but did not f/u.  (FYI: I called the pharmacy about previous note of being on permanent recall; was advised the only dose not avail. Is the 7.5 mg dose.)

## 2017-12-19 ENCOUNTER — Encounter: Payer: Self-pay | Admitting: Unknown Physician Specialty

## 2017-12-19 ENCOUNTER — Ambulatory Visit (INDEPENDENT_AMBULATORY_CARE_PROVIDER_SITE_OTHER): Payer: 59 | Admitting: Unknown Physician Specialty

## 2017-12-19 DIAGNOSIS — F322 Major depressive disorder, single episode, severe without psychotic features: Secondary | ICD-10-CM

## 2017-12-19 DIAGNOSIS — F329 Major depressive disorder, single episode, unspecified: Secondary | ICD-10-CM | POA: Diagnosis not present

## 2017-12-19 DIAGNOSIS — R45851 Suicidal ideations: Secondary | ICD-10-CM | POA: Insufficient documentation

## 2017-12-19 DIAGNOSIS — F32A Depression, unspecified: Secondary | ICD-10-CM

## 2017-12-19 DIAGNOSIS — F325 Major depressive disorder, single episode, in full remission: Secondary | ICD-10-CM | POA: Insufficient documentation

## 2017-12-19 DIAGNOSIS — R5383 Other fatigue: Secondary | ICD-10-CM | POA: Diagnosis not present

## 2017-12-19 MED ORDER — LURASIDONE HCL 20 MG PO TABS: 20.0000 mg | ORAL_TABLET | Freq: Every day | ORAL | 1 refills | Status: DC

## 2017-12-19 NOTE — Progress Notes (Signed)
BP 123/86   Pulse 82   Temp 98.5 F (36.9 C) (Oral)   Wt 206 lb (93.4 kg)   SpO2 98%   BMI 29.81 kg/m    Subjective:    Patient ID: Rick Mendez, male    DOB: Jul 25, 1976, 42 y.o.   MRN: [id number]  HPI: Rick Mendez is a 42 y.o. male  Chief Complaint  Patient presents with  . Depression   Anxiety/depression Pt is here to f/u of anxiety and depression.  Last visit we started on Buspar as concerned about sexual side-effects of SSRIs. However lost to f/u and doesn't feel Buspar was useful  Presents today with more symptoms of depression.   States the biggest difficulty is being unhappy and "no joy."  Lack of interest in doing things is also effecting his relationship.  He is always tired.  He has never been on a medication before.  He admits to being depressed for several years.  No memory of a traumatic stress.  States his father was on Valium for years.  Denies suicidal plans.    Depression screen [facility] 2/9 12/19/2017 10/19/2017 11/19/2016 10/01/2015  Decreased Interest 3 2 0 1  Down, Depressed, Hopeless 2 2 0 1  PHQ - 2 Score 5 4 0 2  Altered sleeping 1 1 - 2  Tired, decreased energy 3 1 - 1  Change in appetite 3 2 - 1  Feeling bad or failure about yourself  2 2 - 1  Trouble concentrating 2 1 - 0  Moving slowly or fidgety/restless 2 0 - 0  Suicidal thoughts 2 1 - 0  PHQ-9 Score 20 12 - 7   Labs from 11/28 reviewed and all were normal   Relevant past medical, surgical, family and social history reviewed and updated as indicated. Interim medical history since our last visit reviewed. Allergies and medications reviewed and updated.  Review of Systems  Constitutional: Positive for activity change and fatigue.  HENT: Negative.   Respiratory: Negative.   Cardiovascular: Negative.     Per HPI unless specifically indicated above     Objective:    BP 123/86   Pulse 82   Temp 98.5 F (36.9 C) (Oral)   Wt 206 lb (93.4 kg)   SpO2 98%   BMI 29.81 kg/m   Wt  Readings from Last 3 Encounters:  12/19/17 206 lb (93.4 kg)  10/19/17 206 lb 8 oz (93.7 kg)  11/19/16 196 lb (88.9 kg)    Physical Exam  Constitutional: He is oriented to person, place, and time. He appears well-developed and well-nourished. No distress.  HENT:  Head: Normocephalic and atraumatic.  Eyes: Conjunctivae and lids are normal. Right eye exhibits no discharge. Left eye exhibits no discharge. No scleral icterus.  Neck: Normal range of motion. Neck supple. No JVD present. Carotid bruit is not present.  Cardiovascular: Normal rate, regular rhythm and normal heart sounds.  Pulmonary/Chest: Effort normal and breath sounds normal. No respiratory distress.  Abdominal: Normal appearance. There is no splenomegaly or hepatomegaly.  Musculoskeletal: Normal range of motion.  Neurological: He is alert and oriented to person, place, and time.  Skin: Skin is warm, dry and intact. No rash noted. No pallor.  Psychiatric: He has a normal mood and affect. His behavior is normal. Judgment and thought content normal.   MDQ 7 yes answers  Assessment & Plan:   Problem List Items Addressed This Visit      Unprioritized   Depression, major, single episode,  severe (HCC)    Significant depression with high suspicion of bipolar disorder.  Start JordanLatuda today.  Close 2 week follow-up.        Fatigue    Last labs reviewed and are normal. Will treat depression and see if it resolves      Suicidal ideation    No plans.  Instructed to put access of guns into wife's care.  Safety contract made         Greater than 50% of this  25 minute visit was spent in counseling/coordination of care regarding depression.    Follow up plan: Return in about 1 week (around 12/26/2017).

## 2017-12-19 NOTE — Assessment & Plan Note (Signed)
Significant depression with high suspicion of bipolar disorder.  Start JordanLatuda today.  Close 2 week follow-up.

## 2017-12-19 NOTE — Assessment & Plan Note (Signed)
Last labs reviewed and are normal. Will treat depression and see if it resolves

## 2017-12-19 NOTE — Assessment & Plan Note (Signed)
No plans.  Instructed to put access of guns into wife's care.  Safety contract made

## 2017-12-19 NOTE — Patient Instructions (Addendum)
Bipolar 2 Disorder Bipolar 2 disorder is a mental health disorder in which a person has episodes of emotional highs (mania) and lows (depression). Bipolar 2 is different from other bipolar disorders because the manic episodes are not as high and do not last as long. This is called hypomania. People with bipolar 2 disorder usually go back and forth between hypomanic and depressive episodes. What are the causes? The cause of this condition is not known. What increases the risk? The following factors may make you more likely to develop this condition:  Having a family member with the disorder.  An imbalance of certain chemicals in the brain (neurotransmitters).  Stress, such as a death, illness, or financial problems.  Certain conditions that affect the brain or spinal cord (neurologic conditions).  Brain injury (trauma).  Having another mental health disorder, such as: ? Obsessive compulsive disorder. ? Schizophrenia.  What are the signs or symptoms? Symptoms of hypomania include:  Very high self-esteem or self-confidence.  Decreased need for sleep.  Unusual talkativeness or feeling a need to keep talking. Speech may be very fast. It may seem like you cannot stop talking.  Racing thoughts or constant talking, with quick shifts between topics that may or may not be related (flight of ideas).  Decreased ability to focus or concentrate.  Increased purposeful activity, such as work, studies, or social activity.  Increased nonproductive activity. This could be pacing, squirming and fidgeting, or finger and toe tapping.  Impulsive behavior and poor judgment. This may result in high-risk activities, such as having unprotected sex or spending a lot of money.  Symptoms of depression include:  Feeling sad, hopeless, or helpless.  Frequent or uncontrollable crying.  Lack of feeling or caring about anything.  Sleeping too much.  Moving more slowly than usual.  Not being able to  enjoy things you used to enjoy.  Desire to be alone all the time.  Feeling guilty or worthless.  Lack of energy or motivation.  Trouble concentrating or remembering.  Trouble making decisions.  Increased appetite.  Thoughts of death or desire to harm yourself.  How is this diagnosed? To diagnose bipolar 2 disorder, your health care provider may ask about your:  Emotional episodes.  Medical history.  Alcohol and drug use. This includes prescription medicines. Certain medical conditions and substances can cause symptoms that seem like bipolar disorder (secondary bipolar disorder).  How is this treated? Bipolar 2 disorder is a long-term (chronic) illness. It is best controlled with ongoing (continuous) treatment rather than being treated only when symptoms occur. Treatment may include:  Psychotherapy. Some forms of talk therapy, such as cognitive-behavioral therapy (CBT), can provide support, education, and guidance.  Coping strategies, such as journaling or relaxation exercises. Relaxation exercises include: ? Yoga. ? Meditation. ? Deep breathing.  Lifestyle changes, such as: ? Limiting alcohol and drug use. ? Exercising regularly. ? Getting plenty of sleep. ? Making healthy eating choices.  Medicine. Medicine can be prescribed by a health care provider who specializes in treating mental disorders (psychiatrist). ? Medicines called mood stabilizers are usually prescribed. ? If symptoms occur even while taking a mood stabilizer, other medicines may be added.  A combination of medicine, talk therapy, and coping methods is the best way to treat this condition. Follow these instructions at home: Activity  Return to your normal activities as told by your health care provider.  Find activities that you enjoy, and make time to do them.  Exercise regularly as told by your health   care provider. Lifestyle  Limit alcohol intake to no more than 1 drink a day for nonpregnant  women and 2 drinks a day for men. One drink equals 12 oz of beer, 5 oz of wine, or 1 oz of hard liquor.  Follow a set schedule for eating and sleeping.  Eat a balanced diet that includes fresh fruits and vegetables, whole grains, low-fat dairy, and lean meats.  Get at least 7-8 hours of sleep each night. General instructions  Take over-the-counter and prescription medicines only as told by your health care provider.  Think about joining a support group. Your health care provider may be able to recommend a support group.  Talk with your family and loved ones about your treatment goals and how they can help.  Keep all follow-up visits as told by your health care provider. This is important. Where to find more information: For more information about bipolar 2 disorder, visit the following websites:  National Alliance on Mental Illness: www.nami.org  U.S. National Institute of Mental Health: www.nimh.nih.gov  Contact a health care provider if:  Your symptoms get worse.  You have side effects from your medicine, and they get worse.  You have trouble sleeping.  You have trouble doing daily activities.  You feel unsafe in your surroundings.  You are dealing with substance abuse. Get help right away if:  You have new symptoms.  You have thoughts about harming yourself or others.  You harm yourself. Summary  Bipolar 2 disorder is a mental health disorder in which a person has episodes of hypomania and depression.  Bipolar 2 is best treated through a combination of medicines, talk therapy, and coping strategies.  Talk with your family and loved ones about your treatment goals and how they can help. This information is not intended to replace advice given to you by your health care provider. Make sure you discuss any questions you have with your health care provider. Document Released: 12/14/2016 Document Revised: 12/14/2016 Document Reviewed: 12/14/2016 Elsevier Interactive  Patient Education  2018 Elsevier Inc.  

## 2017-12-20 ENCOUNTER — Telehealth: Payer: Self-pay | Admitting: Unknown Physician Specialty

## 2017-12-20 ENCOUNTER — Other Ambulatory Visit: Payer: Self-pay

## 2017-12-20 MED ORDER — BUSPIRONE HCL 5 MG PO TABS: 5.0000 mg | ORAL_TABLET | Freq: Three times a day (TID) | ORAL | 1 refills | Status: DC

## 2017-12-20 NOTE — Telephone Encounter (Signed)
Patient last seen 12/19/17

## 2017-12-20 NOTE — Telephone Encounter (Signed)
No PA received yet. Started PA and it is pending review.  Key: [id number]  Routing to CMA as Lorain ChildesFYI

## 2017-12-20 NOTE — Telephone Encounter (Signed)
Copied from CRM [phone]. Topic: Quick Communication - See Telephone Encounter >> Dec 20, 2017  9:33 AM Floria RavelingStovall, Shana A wrote: CRM for notification. See Telephone encounter for: pt called in stating that the  lurasidone (LATUDA) 20 MG TABS tablet [[id number]][[id number]] is needing a PA  , did you rec it or has the process been started?    12/20/17.

## 2017-12-20 NOTE — Telephone Encounter (Signed)
Received approval letter for patient's medication. Called and let patient know that RX was approved.

## 2017-12-22 ENCOUNTER — Telehealth: Payer: Self-pay

## 2017-12-22 NOTE — Telephone Encounter (Signed)
Copied from CRM [phone]. Topic: Quick Communication - See Telephone Encounter >> Dec 20, 2017  9:33 AM Floria RavelingStovall, Shana A wrote: CRM for notification. See Telephone encounter for: pt called in stating that the  lurasidone (LATUDA) 20 MG TABS tablet [[id number]][[id number]] is needing a PA  , did you rec it or has the process been started?    12/20/17. >> Dec 21, 2017  1:24 PM Louie BunPalacios Medina, Rosey Batheresa D wrote: Patient called and said that the medication was too much out of pocket and would like if Elnita MaxwellCheryl can call him in something less expensive that his insurance will cover. Please call patient back, thanks. >> Dec 21, 2017  4:52 PM Pablo Ledgerussell, Ostin Mathey N, CMA wrote: PA for Kasandra KnudsenLatuda was submitted and approved. Tried calling pharmacy to see if they were notified of this but was on hold for 15 minutes. Will try to call again first thing in the morning.    Called and spoke to pharmacy tech. Prescription is still costing the patient $400 even with the approval from insurance. Is there another medication we can change the patient to?

## 2017-12-23 MED ORDER — LITHIUM CARBONATE ER 300 MG PO TBCR: 300.0000 mg | EXTENDED_RELEASE_TABLET | Freq: Two times a day (BID) | ORAL | 0 refills | Status: DC

## 2017-12-23 NOTE — Telephone Encounter (Signed)
Well darn.  I will call in Lithium

## 2017-12-23 NOTE — Telephone Encounter (Signed)
Patient notified about medication change  

## 2017-12-23 NOTE — Telephone Encounter (Signed)
Called and spoke to patient. He states that with the insurance it was going to cost around $1000 because of the plan he has. Patient states that the savings card brought the RX down to $400.

## 2017-12-23 NOTE — Telephone Encounter (Signed)
Is this true even with the savings card he was given?

## 2017-12-28 ENCOUNTER — Encounter: Payer: Self-pay | Admitting: Unknown Physician Specialty

## 2017-12-28 ENCOUNTER — Ambulatory Visit (INDEPENDENT_AMBULATORY_CARE_PROVIDER_SITE_OTHER): Payer: 59 | Admitting: Unknown Physician Specialty

## 2017-12-28 DIAGNOSIS — F322 Major depressive disorder, single episode, severe without psychotic features: Secondary | ICD-10-CM | POA: Diagnosis not present

## 2017-12-28 MED ORDER — ESCITALOPRAM OXALATE 10 MG PO TABS: 10.0000 mg | ORAL_TABLET | Freq: Every day | ORAL | 0 refills | Status: DC

## 2017-12-28 NOTE — Progress Notes (Signed)
BP 132/84   Pulse 80   Temp 98.4 F (36.9 C) (Oral)   Wt 204 lb 12.8 oz (92.9 kg)   SpO2 98%   BMI 29.64 kg/m    Subjective:    Patient ID: Rick HartiganAubrey L Landsberg, male    DOB: December 29, 1975, 42 y.o.   MRN: [id number]  HPI: Rick Hartiganubrey L Clites is a 42 y.o. male  Chief Complaint  Patient presents with  . Depression    1 week f/up   Depression Pt with suspected bipolar depression.  Had trouble with approval for Latuda.  Started on Lithium 300 mg BID.  Not feeling much effect yet and maybe "a slight bit" better.   [facility] 12/28/2017 12/19/2017 10/19/2017 11/19/2016 10/01/2015  Decreased Interest 2 3 2  0 1  Down, Depressed, Hopeless 2 2 2  0 1  PHQ - 2 Score 4 5 4  0 2  Altered sleeping 2 1 1  - 2  Tired, decreased energy 3 3 1  - 1  Change in appetite 2 3 2  - 1  Feeling bad or failure about yourself  2 2 2  - 1  Trouble concentrating 2 2 1  - 0  Moving slowly or fidgety/restless 2 2 0 - 0  Suicidal thoughts 2 2 1  - 0  PHQ-9 Score 19 20 12  - 7    Relevant past medical, surgical, family and social history reviewed and updated as indicated. Interim medical history since our last visit reviewed. Allergies and medications reviewed and updated.  Review of Systems  Per HPI unless specifically indicated above     Objective:    BP 132/84   Pulse 80   Temp 98.4 F (36.9 C) (Oral)   Wt 204 lb 12.8 oz (92.9 kg)   SpO2 98%   BMI 29.64 kg/m   Wt Readings from Last 3 Encounters:  12/28/17 204 lb 12.8 oz (92.9 kg)  12/19/17 206 lb (93.4 kg)  10/19/17 206 lb 8 oz (93.7 kg)    Physical Exam  Constitutional: He is oriented to person, place, and time. He appears well-developed and well-nourished. No distress.  HENT:  Head: Normocephalic and atraumatic.  Eyes: Conjunctivae and lids are normal. Right eye exhibits no discharge. Left eye exhibits no discharge. No scleral icterus.  Cardiovascular: Normal rate.  Pulmonary/Chest: Effort normal.  Abdominal: Normal appearance. There  is no splenomegaly or hepatomegaly.  Musculoskeletal: Normal range of motion.  Neurological: He is alert and oriented to person, place, and time.  Skin: Skin is intact. No rash noted. No pallor.  Psychiatric: He has a normal mood and affect. His behavior is normal. Judgment and thought content normal.    Results for orders placed or performed in visit on 10/19/17  CBC with Differential/Platelet  Result Value Ref Range   WBC 7.0 3.4 - 10.8 x10E3/uL   RBC 5.00 4.14 - 5.80 x10E6/uL   Hemoglobin 16.1 13.0 - 17.7 g/dL   Hematocrit 40.947.0 81.137.5 - 51.0 %   MCV 94 79 - 97 fL   MCH 32.2 26.6 - 33.0 pg   MCHC 34.3 31.5 - 35.7 g/dL   RDW 91.413.3 78.212.3 - 95.615.4 %   Platelets 203 150 - 379 x10E3/uL   Neutrophils 62 Not Estab. %   Lymphs 28 Not Estab. %   Monocytes 5 Not Estab. %   Eos 5 Not Estab. %   Basos 0 Not Estab. %   Neutrophils Absolute 4.3 1.4 - 7.0 x10E3/uL   Lymphocytes Absolute 1.9 0.7 - 3.1 x10E3/uL  Monocytes Absolute 0.4 0.1 - 0.9 x10E3/uL   EOS (ABSOLUTE) 0.4 0.0 - 0.4 x10E3/uL   Basophils Absolute 0.0 0.0 - 0.2 x10E3/uL   Immature Granulocytes 0 Not Estab. %   Immature Grans (Abs) 0.0 0.0 - 0.1 x10E3/uL  Comprehensive metabolic panel  Result Value Ref Range   Glucose 75 65 - 99 mg/dL   BUN 15 6 - 24 mg/dL   Creatinine, Ser 1.61 0.76 - 1.27 mg/dL   GFR calc non Af Amer 104 >59 mL/min/1.73   GFR calc Af Amer 120 >59 mL/min/1.73   BUN/Creatinine Ratio 16 9 - 20   Sodium 144 134 - 144 mmol/L   Potassium 5.2 3.5 - 5.2 mmol/L   Chloride 106 96 - 106 mmol/L   CO2 24 20 - 29 mmol/L   Calcium 9.6 8.7 - 10.2 mg/dL   Total Protein 7.0 6.0 - 8.5 g/dL   Albumin 4.5 3.5 - 5.5 g/dL   Globulin, Total 2.5 1.5 - 4.5 g/dL   Albumin/Globulin Ratio 1.8 1.2 - 2.2   Bilirubin Total 0.4 0.0 - 1.2 mg/dL   Alkaline Phosphatase 85 39 - 117 IU/L   AST 17 0 - 40 IU/L   ALT 21 0 - 44 IU/L  Lipid Panel w/o Chol/HDL Ratio  Result Value Ref Range   Cholesterol, Total 154 100 - 199 mg/dL    Triglycerides 57 0 - 149 mg/dL   HDL 41 >09 mg/dL   VLDL Cholesterol Cal 11 5 - 40 mg/dL   LDL Calculated 604 (H) 0 - 99 mg/dL  TSH  Result Value Ref Range   TSH 2.070 0.450 - 4.500 uIU/mL      Assessment & Plan:   Problem List Items Addressed This Visit      Unprioritized   Depression, major, single episode, severe (HCC)    Taking Lithium due to suspicion of bipolar disorder. Now that he is on a mood stabilizer, add SSRI.  Add Lexapro 10 mg.        Relevant Medications   escitalopram (LEXAPRO) 10 MG tablet       Follow up plan: Return in about 1 week (around 01/04/2018).

## 2017-12-28 NOTE — Assessment & Plan Note (Addendum)
Taking Lithium due to suspicion of bipolar disorder. Now that he is on a mood stabilizer, add SSRI.  Add Lexapro 10 mg.

## 2018-01-04 ENCOUNTER — Encounter: Payer: Self-pay | Admitting: Unknown Physician Specialty

## 2018-01-04 ENCOUNTER — Ambulatory Visit (INDEPENDENT_AMBULATORY_CARE_PROVIDER_SITE_OTHER): Payer: 59 | Admitting: Unknown Physician Specialty

## 2018-01-04 VITALS — BP 122/79 | HR 70 | Temp 98.4°F | Wt 205.4 lb

## 2018-01-04 DIAGNOSIS — Z5181 Encounter for therapeutic drug level monitoring: Secondary | ICD-10-CM | POA: Diagnosis not present

## 2018-01-04 DIAGNOSIS — F322 Major depressive disorder, single episode, severe without psychotic features: Secondary | ICD-10-CM | POA: Diagnosis not present

## 2018-01-04 NOTE — Progress Notes (Signed)
BP 122/79   Pulse 70   Temp 98.4 F (36.9 C) (Oral)   Wt 205 lb 6.4 oz (93.2 kg)   SpO2 97%   BMI 29.73 kg/m    Subjective:    Patient ID: Rick Mendez, male    DOB: January 06, 1976, 42 y.o.   MRN: [id number]  HPI: Rick Mendez is a 42 y.o. male  Chief Complaint  Patient presents with  . Depression    1 week f/up    Pt is here to follow up on depression with high suspicion of bipolar depression.  Last visit started Escitalopram plus Lithium as a mood stabilizer.  States he is motivated to do things and generally happier.   Depression screen [facility] 2/9 01/04/2018 12/28/2017 12/19/2017 10/19/2017 11/19/2016  Decreased Interest 2 2 3 2  0  Down, Depressed, Hopeless 1 2 2 2  0  PHQ - 2 Score 3 4 5 4  0  Altered sleeping 1 2 1 1  -  Tired, decreased energy 1 3 3 1  -  Change in appetite 2 2 3 2  -  Feeling bad or failure about yourself  1 2 2 2  -  Trouble concentrating 1 2 2 1  -  Moving slowly or fidgety/restless 2 2 2  0 -  Suicidal thoughts 1 2 2 1  -  PHQ-9 Score 12 19 20 12  -   Relevant past medical, surgical, family and social history reviewed and updated as indicated. Interim medical history since our last visit reviewed. Allergies and medications reviewed and updated.  Review of Systems  Per HPI unless specifically indicated above     Objective:    BP 122/79   Pulse 70   Temp 98.4 F (36.9 C) (Oral)   Wt 205 lb 6.4 oz (93.2 kg)   SpO2 97%   BMI 29.73 kg/m   Wt Readings from Last 3 Encounters:  01/04/18 205 lb 6.4 oz (93.2 kg)  12/28/17 204 lb 12.8 oz (92.9 kg)  12/19/17 206 lb (93.4 kg)    Physical Exam  Constitutional: He is oriented to person, place, and time. He appears well-developed and well-nourished. No distress.  HENT:  Head: Normocephalic and atraumatic.  Eyes: Conjunctivae and lids are normal. Right eye exhibits no discharge. Left eye exhibits no discharge. No scleral icterus.  Cardiovascular: Normal rate.  Pulmonary/Chest: Effort normal.    Abdominal: Normal appearance. There is no splenomegaly or hepatomegaly.  Musculoskeletal: Normal range of motion.  Neurological: He is alert and oriented to person, place, and time.  Skin: Skin is intact. No rash noted. No pallor.  Psychiatric: He has a normal mood and affect. His behavior is normal. Judgment and thought content normal.    Results for orders placed or performed in visit on 10/19/17  CBC with Differential/Platelet  Result Value Ref Range   WBC 7.0 3.4 - 10.8 x10E3/uL   RBC 5.00 4.14 - 5.80 x10E6/uL   Hemoglobin 16.1 13.0 - 17.7 g/dL   Hematocrit 40.9 81.1 - 51.0 %   MCV 94 79 - 97 fL   MCH 32.2 26.6 - 33.0 pg   MCHC 34.3 31.5 - 35.7 g/dL   RDW 91.4 78.2 - 95.6 %   Platelets 203 150 - 379 x10E3/uL   Neutrophils 62 Not Estab. %   Lymphs 28 Not Estab. %   Monocytes 5 Not Estab. %   Eos 5 Not Estab. %   Basos 0 Not Estab. %   Neutrophils Absolute 4.3 1.4 - 7.0 x10E3/uL   Lymphocytes Absolute  1.9 0.7 - 3.1 x10E3/uL   Monocytes Absolute 0.4 0.1 - 0.9 x10E3/uL   EOS (ABSOLUTE) 0.4 0.0 - 0.4 x10E3/uL   Basophils Absolute 0.0 0.0 - 0.2 x10E3/uL   Immature Granulocytes 0 Not Estab. %   Immature Grans (Abs) 0.0 0.0 - 0.1 x10E3/uL  Comprehensive metabolic panel  Result Value Ref Range   Glucose 75 65 - 99 mg/dL   BUN 15 6 - 24 mg/dL   Creatinine, Ser 1.610.92 0.76 - 1.27 mg/dL   GFR calc non Af Amer 104 >59 mL/min/1.73   GFR calc Af Amer 120 >59 mL/min/1.73   BUN/Creatinine Ratio 16 9 - 20   Sodium 144 134 - 144 mmol/L   Potassium 5.2 3.5 - 5.2 mmol/L   Chloride 106 96 - 106 mmol/L   CO2 24 20 - 29 mmol/L   Calcium 9.6 8.7 - 10.2 mg/dL   Total Protein 7.0 6.0 - 8.5 g/dL   Albumin 4.5 3.5 - 5.5 g/dL   Globulin, Total 2.5 1.5 - 4.5 g/dL   Albumin/Globulin Ratio 1.8 1.2 - 2.2   Bilirubin Total 0.4 0.0 - 1.2 mg/dL   Alkaline Phosphatase 85 39 - 117 IU/L   AST 17 0 - 40 IU/L   ALT 21 0 - 44 IU/L  Lipid Panel w/o Chol/HDL Ratio  Result Value Ref Range   Cholesterol,  Total 154 100 - 199 mg/dL   Triglycerides 57 0 - 149 mg/dL   HDL 41 >09>39 mg/dL   VLDL Cholesterol Cal 11 5 - 40 mg/dL   LDL Calculated 604102 (H) 0 - 99 mg/dL  TSH  Result Value Ref Range   TSH 2.070 0.450 - 4.500 uIU/mL      Assessment & Plan:   Problem List Items Addressed This Visit      Unprioritized   Depression, major, single episode, severe (HCC)    Not to goal but improved.  Full effect not expected for a few week.  If doing well next visit, taper Lithium.  Lithium levels today.         Other Visit Diagnoses    Medication monitoring encounter    -  Primary   Relevant Orders   Lithium level   Lithium level   Comprehensive metabolic panel       Follow up plan: Return in about 4 weeks (around 02/01/2018).

## 2018-01-04 NOTE — Assessment & Plan Note (Signed)
Not to goal but improved.  Full effect not expected for a few week.  If doing well next visit, taper Lithium.  Lithium levels today.

## 2018-01-05 LAB — COMPREHENSIVE METABOLIC PANEL
ALBUMIN: 4.4 g/dL (ref 3.5–5.5)
ALK PHOS: 71 IU/L (ref 39–117)
ALT: 13 IU/L (ref 0–44)
AST: 15 IU/L (ref 0–40)
Albumin/Globulin Ratio: 1.8 (ref 1.2–2.2)
BUN / CREAT RATIO: 13 (ref 9–20)
BUN: 11 mg/dL (ref 6–24)
Bilirubin Total: 0.5 mg/dL (ref 0.0–1.2)
CHLORIDE: 103 mmol/L (ref 96–106)
CO2: 22 mmol/L (ref 20–29)
Calcium: 9.6 mg/dL (ref 8.7–10.2)
Creatinine, Ser: 0.86 mg/dL (ref 0.76–1.27)
GFR calc Af Amer: 125 mL/min/{1.73_m2} (ref >59)
GFR calc non Af Amer: 108 mL/min/{1.73_m2} (ref >59)
GLOBULIN, TOTAL: 2.4 g/dL (ref 1.5–4.5)
GLUCOSE: 85 mg/dL (ref 65–99)
POTASSIUM: 4.3 mmol/L (ref 3.5–5.2)
SODIUM: 139 mmol/L (ref 134–144)
Total Protein: 6.8 g/dL (ref 6.0–8.5)

## 2018-01-05 LAB — LITHIUM LEVEL: Lithium Lvl: 0.5 mmol/L — ABNORMAL LOW (ref 0.6–1.2)

## 2018-01-06 ENCOUNTER — Encounter: Payer: Self-pay | Admitting: Unknown Physician Specialty

## 2018-01-19 ENCOUNTER — Other Ambulatory Visit: Payer: Self-pay | Admitting: Unknown Physician Specialty

## 2018-01-24 ENCOUNTER — Other Ambulatory Visit: Payer: Self-pay | Admitting: Unknown Physician Specialty

## 2018-02-10 ENCOUNTER — Ambulatory Visit: Payer: 59 | Admitting: Unknown Physician Specialty

## 2018-02-26 ENCOUNTER — Other Ambulatory Visit: Payer: Self-pay | Admitting: Family Medicine

## 2018-03-09 ENCOUNTER — Other Ambulatory Visit: Payer: Self-pay | Admitting: Unknown Physician Specialty

## 2018-03-09 NOTE — Telephone Encounter (Signed)
sildenafil refill Last OV: 10/19/17 Last Refill:10/19/17 #30 tab 2 RF Pharmacy:[facility] [address] PCP: Gabriel Cirriheryl Wicker NP

## 2018-03-10 ENCOUNTER — Other Ambulatory Visit: Payer: Self-pay

## 2018-03-10 MED ORDER — ESCITALOPRAM OXALATE 10 MG PO TABS: 10.0000 mg | ORAL_TABLET | Freq: Every day | ORAL | 1 refills | Status: DC

## 2018-03-10 NOTE — Telephone Encounter (Signed)
Pharmacy sent a fax requesting a 90 day RX be sent in on patient's escitalopram per insurance.

## 2018-06-22 ENCOUNTER — Encounter: Payer: Self-pay | Admitting: Physician Assistant

## 2018-06-22 ENCOUNTER — Ambulatory Visit (INDEPENDENT_AMBULATORY_CARE_PROVIDER_SITE_OTHER): Payer: 59 | Admitting: Physician Assistant

## 2018-06-22 VITALS — BP 124/85 | HR 62 | Ht 69.49 in | Wt 201.0 lb

## 2018-06-22 DIAGNOSIS — Z13220 Encounter for screening for lipoid disorders: Secondary | ICD-10-CM

## 2018-06-22 DIAGNOSIS — Z131 Encounter for screening for diabetes mellitus: Secondary | ICD-10-CM

## 2018-06-22 DIAGNOSIS — Z0000 Encounter for general adult medical examination without abnormal findings: Secondary | ICD-10-CM | POA: Diagnosis not present

## 2018-06-22 DIAGNOSIS — F322 Major depressive disorder, single episode, severe without psychotic features: Secondary | ICD-10-CM | POA: Diagnosis not present

## 2018-06-22 NOTE — Patient Instructions (Signed)

## 2018-06-22 NOTE — Progress Notes (Signed)
Subjective:    Patient ID: Rick Mendez, male    DOB: [birth year], 42 y.o.   MRN: [id number]  Rick Mendez is a 42 y.o. male presenting on 06/22/2018 for Annual Exam   HPI   Presents today for physical exam and depression follow up. He is the site Production designer, theatre/television/film for [employer], [employer] in [place]. Living in [place], [place]. Living with wife - married 14 years this weekend.Three children, older daughter aged 31, and two sons ages 71 and 45.  Smoking: 1 pack a day for 20 years - currently down from 1.5 pack Interested in quitting: working on it himself by reducing intake, does not desire medication.  Asks about PSA today. Denies urinary frequency or urgency. Denies family history of prostate cancer.   Depression  Previously placed on Lithium in February but reports this made him feel ill. He discontinued it and is no longer taking this medication. He does continue to take Lexapro 10 mg daily which he reports is helpful to him. He would like to remain on this dose.   Social History   Tobacco Use  . Smoking status: Current Every Day Smoker    Packs/day: 1.50    Years: 19.00    Pack years: 28.50    Types: Cigarettes  . Smokeless tobacco: Never Used  Substance Use Topics  . Alcohol use: Yes    Comment: social drinker  . Drug use: No    Review of Systems Per HPI unless specifically indicated above     Objective:    BP 124/85   Pulse 62   Ht 5' 9.49" (1.765 m)   Wt 201 lb (91.2 kg)   SpO2 99%   BMI 29.27 kg/m   Wt Readings from Last 3 Encounters:  06/22/18 201 lb (91.2 kg)  01/04/18 205 lb 6.4 oz (93.2 kg)  12/28/17 204 lb 12.8 oz (92.9 kg)    Physical Exam  Constitutional: He is oriented to person, place, and time. He appears well-developed and well-nourished.  Cardiovascular: Normal rate and regular rhythm.  Pulmonary/Chest: Effort normal and breath sounds normal.  Abdominal: Soft. Bowel sounds are normal.  Neurological: He is alert and oriented to person, place,  and time.  Skin: Skin is warm and dry.  Psychiatric: He has a normal mood and affect. His behavior is normal.   Results for orders placed or performed in visit on 01/04/18  Lithium level  Result Value Ref Range   Lithium Lvl 0.5 (L) 0.6 - 1.2 mmol/L  Comprehensive metabolic panel  Result Value Ref Range   Glucose 85 65 - 99 mg/dL   BUN 11 6 - 24 mg/dL   Creatinine, Ser 1.47 0.76 - 1.27 mg/dL   GFR calc non Af Amer 108 >59 mL/min/1.73   GFR calc Af Amer 125 >59 mL/min/1.73   BUN/Creatinine Ratio 13 9 - 20   Sodium 139 134 - 144 mmol/L   Potassium 4.3 3.5 - 5.2 mmol/L   Chloride 103 96 - 106 mmol/L   CO2 22 20 - 29 mmol/L   Calcium 9.6 8.7 - 10.2 mg/dL   Total Protein 6.8 6.0 - 8.5 g/dL   Albumin 4.4 3.5 - 5.5 g/dL   Globulin, Total 2.4 1.5 - 4.5 g/dL   Albumin/Globulin Ratio 1.8 1.2 - 2.2   Bilirubin Total 0.5 0.0 - 1.2 mg/dL   Alkaline Phosphatase 71 39 - 117 IU/L   AST 15 0 - 40 IU/L   ALT 13 0 - 44 IU/L  [facility]04/2018 [facility]  PHQ-9 Total Score  12         Assessment & Plan:   1. Annual physical exam  Discussed labwork with patient today. Discussed that at his age and with no family history, I do feel the risks outweigh the benefits of screening PSA.   2. Diabetes mellitus screening  - Comprehensive Metabolic Panel (CMET) - HgB A1c  3. Lipid screening  - Lipid Profile  4. Depression, major, single episode, severe (HCC)  He has discontinued Lithium and is currently taking only 10 mg Lexapro daily. He is currently stable on this regimen and wishes to continue. F/u 6 months to one year.     Follow up plan: Return in about 1 year (around 06/23/2019) for depression/CPE.  Osvaldo AngstAdriana Pollak, PA-C [facility]  [facility] 06/22/2018, 10:38 AM

## 2018-06-23 LAB — HEMOGLOBIN A1C
Est. average glucose Bld gHb Est-mCnc: 97 mg/dL
Hgb A1c MFr Bld: 5 % (ref 4.8–5.6)

## 2018-06-23 LAB — COMPREHENSIVE METABOLIC PANEL
ALT: 20 IU/L (ref 0–44)
AST: 15 IU/L (ref 0–40)
Albumin/Globulin Ratio: 1.7 (ref 1.2–2.2)
Albumin: 4.3 g/dL (ref 3.5–5.5)
Alkaline Phosphatase: 85 IU/L (ref 39–117)
BUN/Creatinine Ratio: 14 (ref 9–20)
BUN: 13 mg/dL (ref 6–24)
Bilirubin Total: 0.6 mg/dL (ref 0.0–1.2)
CO2: 25 mmol/L (ref 20–29)
Calcium: 9.6 mg/dL (ref 8.7–10.2)
Chloride: 104 mmol/L (ref 96–106)
Creatinine, Ser: 0.95 mg/dL (ref 0.76–1.27)
GFR calc Af Amer: 115 mL/min/{1.73_m2} (ref >59)
GFR calc non Af Amer: 100 mL/min/{1.73_m2} (ref >59)
Globulin, Total: 2.5 g/dL (ref 1.5–4.5)
Glucose: 84 mg/dL (ref 65–99)
Potassium: 4.6 mmol/L (ref 3.5–5.2)
Sodium: 140 mmol/L (ref 134–144)
Total Protein: 6.8 g/dL (ref 6.0–8.5)

## 2018-06-23 LAB — LIPID PANEL
Chol/HDL Ratio: 4.9 ratio (ref 0.0–5.0)
Cholesterol, Total: 163 mg/dL (ref 100–199)
HDL: 33 mg/dL — ABNORMAL LOW (ref >39)
LDL Calculated: 120 mg/dL — ABNORMAL HIGH (ref 0–99)
Triglycerides: 52 mg/dL (ref 0–149)
VLDL Cholesterol Cal: 10 mg/dL (ref 5–40)

## 2018-10-17 ENCOUNTER — Ambulatory Visit: Payer: Self-pay

## 2018-10-17 NOTE — Telephone Encounter (Signed)
Rec'd call from pt. with c/o worsening dizziness.  Reported periodic dizziness over several months, with increased symptoms over past 2 days.  Described as "like the room is spinning; feels like I'm drunk."  Stated he had a severe episode this morning, and became sweaty.  Denied feeling faint with the severe episodes.  C/o nausea, but no vomiting.  C/o mild headache.  Denied any sinus or ear congestion.  Denied any speech difficulty, or weakness of extremities.  Appt. offered today at 1:30 PM; pt. Declined this appt.,  stating he cannot get off work.  Advised no other available appts. until Monday, 12/2.  The pt. requested an appt. on 12/2.  Advised if symptoms worsen, he should go to UC or ER, prior to Monday's appt.  Encouraged to stay well hydrated.  Care advice given per protocol.  Pt. verb. Understanding.  Agreed with plan.           Reason for Disposition . [1] MODERATE dizziness (e.g., vertigo; feels very unsteady, interferes with normal activities) AND [2] has NOT been evaluated by physician for this  Answer Assessment - Initial Assessment Questions 1. DESCRIPTION: "Describe your dizziness."     The room was spinning; it was affecting my balance; nausea; sweating  2. VERTIGO: "Do you feel like either you or the room is spinning or tilting?"      Yes; room spinning  3. LIGHTHEADED: "Do you feel lightheaded?" (e.g., somewhat faint, woozy, weak upon standing)     Denied feeling faint; "feels like I'm drunk"  4. SEVERITY: "How bad is it?"  "Can you walk?"   - MILD - Feels unsteady but walking normally.   - MODERATE - Feels very unsteady when walking, but not falling; interferes with normal activities (e.g., school, work) .   - SEVERE - Unable to walk without falling (requires assistance).     Severe  5. ONSET:  "When did the dizziness begin?"     Periodically; over period of months; worse the past 2 days  6. AGGRAVATING FACTORS: "Does anything make it worse?" (e.g., standing, change in head  position)     Turning head or bending 7. CAUSE: "What do you think is causing the dizziness?"     Unknown  8. RECURRENT SYMPTOM: "Have you had dizziness before?" If so, ask: "When was the last time?" "What happened that time?"     Has experienced this but never been treated 9. OTHER SYMPTOMS: "Do you have any other symptoms?" (e.g., headache, weakness, numbness, vomiting, earache)    Denied sinus or ear congestion.  Reported mild headache at present; had fever last week, intermittent nausea; no vomiting.  Denied speech changes, or weakness of extremities.  Protocols used: DIZZINESS - VERTIGO-A-AH

## 2018-10-23 ENCOUNTER — Ambulatory Visit (INDEPENDENT_AMBULATORY_CARE_PROVIDER_SITE_OTHER): Payer: 59 | Admitting: Nurse Practitioner

## 2018-10-23 ENCOUNTER — Other Ambulatory Visit: Payer: Self-pay

## 2018-10-23 ENCOUNTER — Encounter: Payer: Self-pay | Admitting: Nurse Practitioner

## 2018-10-23 VITALS — BP 114/78 | HR 75 | Temp 97.5°F | Ht 69.0 in | Wt 207.0 lb

## 2018-10-23 DIAGNOSIS — Z23 Encounter for immunization: Secondary | ICD-10-CM | POA: Diagnosis not present

## 2018-10-23 DIAGNOSIS — R42 Dizziness and giddiness: Secondary | ICD-10-CM | POA: Insufficient documentation

## 2018-10-23 MED ORDER — MECLIZINE HCL 12.5 MG PO TABS: 12.5000 mg | ORAL_TABLET | Freq: Three times a day (TID) | ORAL | 1 refills | Status: DC | PRN

## 2018-10-23 NOTE — Assessment & Plan Note (Addendum)
Acute over past year with episodes.  Labs today: A1C, CBC, anemia panel, CMP, thyroid panel.  Script for Meclizine provided to use as needed with incidences.  Discussed keeping a journal over next month of when events happen + precipitating factors + other symptoms present (headache, aura, N&V).  Return in one month for follow-up with journal.
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
## 2018-10-23 NOTE — Patient Instructions (Signed)
Meclizine tablets or capsules What is this medicine? MECLIZINE (MEK li zeen) is an antihistamine. It is used to prevent nausea, vomiting, or dizziness caused by motion sickness. It is also used to prevent and treat vertigo (extreme dizziness or a feeling that you or your surroundings are tilting or spinning around). This medicine may be used for other purposes; ask your health care provider or pharmacist if you have questions. COMMON BRAND NAME(S): Antivert, Dramamine Less Drowsy, Medivert, Meni-D What should I tell my health care provider before I take this medicine? They need to know if you have any of these conditions: -glaucoma -lung or breathing disease, like asthma -problems urinating -prostate disease -stomach or intestine problems -an unusual or allergic reaction to meclizine, other medicines, foods, dyes, or preservatives -pregnant or trying to get pregnant -breast-feeding How should I use this medicine? Take this medicine by mouth with a glass of water. Follow the directions on the prescription label. If you are using this medicine to prevent motion sickness, take the dose at least 1 hour before travel. If it upsets your stomach, take it with food or milk. Take your doses at regular intervals. Do not take your medicine more often than directed. Talk to your pediatrician regarding the use of this medicine in children. Special care may be needed. Overdosage: If you think you have taken too much of this medicine contact a poison control center or emergency room at once. NOTE: This medicine is only for you. Do not share this medicine with others. What if I miss a dose? If you miss a dose, take it as soon as you can. If it is almost time for your next dose, take only that dose. Do not take double or extra doses. What may interact with this medicine? Do not take this medicine with any of the following medications: -MAOIs like Carbex, Eldepryl, Marplan, Nardil, and Parnate This medicine  may also interact with the following medications: -alcohol -antihistamines for allergy, cough and cold -certain medicines for anxiety or sleep -certain medicines for depression, like amitriptyline, fluoxetine, sertraline -certain medicines for seizures like phenobarbital, primidone -general anesthetics like halothane, isoflurane, methoxyflurane, propofol -local anesthetics like lidocaine, pramoxine, tetracaine -medicines that relax muscles for surgery -narcotic medicines for pain -phenothiazines like chlorpromazine, mesoridazine, prochlorperazine, thioridazine This list may not describe all possible interactions. Give your health care provider a list of all the medicines, herbs, non-prescription drugs, or dietary supplements you use. Also tell them if you smoke, drink alcohol, or use illegal drugs. Some items may interact with your medicine. What should I watch for while using this medicine? Tell your doctor or healthcare professional if your symptoms do not start to get better or if they get worse. You may get drowsy or dizzy. Do not drive, use machinery, or do anything that needs mental alertness until you know how this medicine affects you. Do not stand or sit up quickly, especially if you are an older patient. This reduces the risk of dizzy or fainting spells. Alcohol may interfere with the effect of this medicine. Avoid alcoholic drinks. Your mouth may get dry. Chewing sugarless gum or sucking hard candy, and drinking plenty of water may help. Contact your doctor if the problem does not go away or is severe. This medicine may cause dry eyes and blurred vision. If you wear contact lenses you may feel some discomfort. Lubricating drops may help. See your eye doctor if the problem does not go away or is severe. What side effects may I   notice from receiving this medicine? Side effects that you should report to your doctor or health care professional as soon as possible: -feeling faint or  lightheaded, falls -fast, irregular heartbeat Side effects that usually do not require medical attention (report to your doctor or health care professional if they continue or are bothersome): -constipation -headache -trouble passing urine or change in the amount of urine -trouble sleeping -upset stomach This list may not describe all possible side effects. Call your doctor for medical advice about side effects. You may report side effects to FDA at 1-800-FDA-1088. Where should I keep my medicine? Keep out of the reach of children. Store at room temperature between 15 and 30 degrees C (59 and 86 degrees F). Keep container tightly closed. Throw away any unused medicine after the expiration date. NOTE: This sheet is a summary. It may not cover all possible information. If you have questions about this medicine, talk to your doctor, pharmacist, or health care provider.  2018 Elsevier/Gold Standard (2015-12-10 19:41:02) Dizziness Dizziness is a common problem. It makes you feel unsteady or light-headed. You may feel like you are about to pass out (faint). Dizziness can lead to getting hurt if you stumble or fall. Dizziness can be caused by many things, including:  Medicines.  Not having enough water in your body (dehydration).  Illness.  Follow these instructions at home: Eating and drinking  Drink enough fluid to keep your pee (urine) clear or pale yellow. This helps to keep you from getting dehydrated. Try to drink more clear fluids, such as water.  Do not drink alcohol.  Limit how much caffeine you drink or eat, if your doctor tells you to do that.  Limit how much salt (sodium) you drink or eat, if your doctor tells you to do that. Activity  Avoid making quick movements. ? When you stand up from sitting in a chair, steady yourself until you feel okay. ? In the morning, first sit up on the side of the bed. When you feel okay, stand slowly while you hold onto something. Do this  until you know that your balance is fine.  If you need to stand in one place for a long time, move your legs often. Tighten and relax the muscles in your legs while you are standing.  Do not drive or use heavy machinery if you feel dizzy.  Avoid bending down if you feel dizzy. Place items in your home so you can reach them easily without leaning over. Lifestyle  Do not use any products that contain nicotine or tobacco, such as cigarettes and e-cigarettes. If you need help quitting, ask your doctor.  Try to lower your stress level. You can do this by using methods such as yoga or meditation. Talk with your doctor if you need help. General instructions  Watch your dizziness for any changes.  Take over-the-counter and prescription medicines only as told by your doctor. Talk with your doctor if you think that you are dizzy because of a medicine that you are taking.  Tell a friend or a family member that you are feeling dizzy. If he or she notices any changes in your behavior, have this person call your doctor.  Keep all follow-up visits as told by your doctor. This is important. Contact a doctor if:  Your dizziness does not go away.  Your dizziness or light-headedness gets worse.  You feel sick to your stomach (nauseous).  You have trouble hearing.  You have new symptoms.  You  are unsteady on your feet.  You feel like the room is spinning. Get help right away if:  You throw up (vomit) or have watery poop (diarrhea), and you cannot eat or drink anything.  You have trouble: ? Talking. ? Walking. ? Swallowing. ? Using your arms, hands, or legs.  You feel generally weak.  You are not thinking clearly, or you have trouble forming sentences. A friend or family member may notice this.  You have: ? Chest pain. ? Pain in your belly (abdomen). ? Shortness of breath. ? Sweating.  Your vision changes.  You are bleeding.  You have a very bad headache.  You have neck pain  or a stiff neck.  You have a fever. These symptoms may be an emergency. Do not wait to see if the symptoms will go away. Get medical help right away. Call your local emergency services (911 in the [place] Do not drive yourself to the hospital. Summary  Dizziness makes you feel unsteady or light-headed. You may feel like you are about to pass out (faint).  Drink enough fluid to keep your pee (urine) clear or pale yellow. Do not drink alcohol.  Avoid making quick movements if you feel dizzy.  Watch your dizziness for any changes. This information is not intended to replace advice given to you by your health care provider. Make sure you discuss any questions you have with your health care provider. Document Released: 10/28/2011 Document Revised: 11/25/2016 Document Reviewed: 11/25/2016 Elsevier Interactive Patient Education  2017 [employer].

## 2018-10-24 LAB — COMPREHENSIVE METABOLIC PANEL
A/G RATIO: 1.9 (ref 1.2–2.2)
ALBUMIN: 4.7 g/dL (ref 3.5–5.5)
ALT: 17 IU/L (ref 0–44)
AST: 17 IU/L (ref 0–40)
Alkaline Phosphatase: 92 IU/L (ref 39–117)
BILIRUBIN TOTAL: 0.3 mg/dL (ref 0.0–1.2)
BUN / CREAT RATIO: 21 — AB (ref 9–20)
BUN: 16 mg/dL (ref 6–24)
CHLORIDE: 101 mmol/L (ref 96–106)
CO2: 26 mmol/L (ref 20–29)
Calcium: 9.6 mg/dL (ref 8.7–10.2)
Creatinine, Ser: 0.78 mg/dL (ref 0.76–1.27)
GFR calc non Af Amer: 112 mL/min/{1.73_m2} (ref >59)
GFR, EST AFRICAN AMERICAN: 130 mL/min/{1.73_m2} (ref >59)
Globulin, Total: 2.5 g/dL (ref 1.5–4.5)
Glucose: 71 mg/dL (ref 65–99)
POTASSIUM: 4 mmol/L (ref 3.5–5.2)
Sodium: 139 mmol/L (ref 134–144)
TOTAL PROTEIN: 7.2 g/dL (ref 6.0–8.5)

## 2018-10-24 LAB — ANEMIA PANEL
Ferritin: 203 ng/mL (ref 30–400)
Folate, Hemolysate: 370.8 ng/mL
Folate, RBC: 828 ng/mL (ref >498)
Hematocrit: 44.8 % (ref 37.5–51.0)
IRON: 85 ug/dL (ref 38–169)
Iron Saturation: 32 % (ref 15–55)
Retic Ct Pct: 1.4 % (ref 0.6–2.6)
Total Iron Binding Capacity: 263 ug/dL (ref 250–450)
UIBC: 178 ug/dL (ref 111–343)
VITAMIN B 12: 320 pg/mL (ref 232–1245)

## 2018-10-24 LAB — CBC WITH DIFFERENTIAL/PLATELET
BASOS: 1 %
Basophils Absolute: 0 10*3/uL (ref 0.0–0.2)
EOS (ABSOLUTE): 0.4 10*3/uL (ref 0.0–0.4)
Eos: 5 %
HEMOGLOBIN: 15.7 g/dL (ref 13.0–17.7)
IMMATURE GRANS (ABS): 0 10*3/uL (ref 0.0–0.1)
Immature Granulocytes: 0 %
LYMPHS: 30 %
Lymphocytes Absolute: 2.1 10*3/uL (ref 0.7–3.1)
MCH: 31.8 pg (ref 26.6–33.0)
MCHC: 35 g/dL (ref 31.5–35.7)
MCV: 91 fL (ref 79–97)
Monocytes Absolute: 0.5 10*3/uL (ref 0.1–0.9)
Monocytes: 7 %
NEUTROS ABS: 4 10*3/uL (ref 1.4–7.0)
NEUTROS PCT: 57 %
Platelets: 221 10*3/uL (ref 150–450)
RBC: 4.94 x10E6/uL (ref 4.14–5.80)
RDW: 12.5 % (ref 12.3–15.4)
WBC: 7 10*3/uL (ref 3.4–10.8)

## 2018-10-24 LAB — HEMOGLOBIN A1C
Est. average glucose Bld gHb Est-mCnc: 97 mg/dL
Hgb A1c MFr Bld: 5 % (ref 4.8–5.6)

## 2018-10-24 LAB — THYROID PANEL WITH TSH
Free Thyroxine Index: 2.3 (ref 1.2–4.9)
T3 Uptake Ratio: 29 % (ref 24–39)
T4, Total: 8.1 ug/dL (ref 4.5–12.0)
TSH: 1.42 u[IU]/mL (ref 0.450–4.500)

## 2018-12-01 ENCOUNTER — Ambulatory Visit: Payer: 59 | Admitting: Nurse Practitioner

## 2018-12-02 ENCOUNTER — Other Ambulatory Visit: Payer: Self-pay | Admitting: Unknown Physician Specialty

## 2018-12-04 NOTE — Telephone Encounter (Signed)
Requested Prescriptions  Pending Prescriptions Disp Refills  . omeprazole (PRILOSEC) 20 MG capsule [Pharmacy Med Name: OMEPRAZOLE DR 20 MG CAPSULE] 90 capsule 0    Sig: TAKE 1 CAPSULE BY MOUTH EVERY DAY     Gastroenterology: Proton Pump Inhibitors Passed - 12/02/2018  1:22 AM      Passed - Valid encounter within last 12 months    Recent Outpatient Visits          1 month ago Dizziness   [facility] [place], [place] T, NP   5 months ago Annual physical exam   [facility] Osvaldo Angstollak, Adriana M, PA-C   11 months ago Medication monitoring encounter   [facility] Gabriel CirriWicker, Cheryl, NP   11 months ago Depression, major, single episode, severe (HCC)   [facility] Gabriel CirriWicker, Cheryl, NP   11 months ago Depression, major, single episode, severe (HCC)   [facility] Gabriel CirriWicker, Cheryl, NP      Future Appointments            In 6 months Cannady, Dorie RankJolene T, NP [facility]

## 2019-06-29 ENCOUNTER — Encounter: Payer: 59 | Admitting: Nurse Practitioner

## 2020-02-14 ENCOUNTER — Encounter: Payer: Self-pay | Admitting: Nurse Practitioner

## 2020-02-18 ENCOUNTER — Encounter: Payer: Self-pay | Admitting: Nurse Practitioner

## 2020-02-18 ENCOUNTER — Other Ambulatory Visit: Payer: Self-pay

## 2020-02-18 ENCOUNTER — Ambulatory Visit (INDEPENDENT_AMBULATORY_CARE_PROVIDER_SITE_OTHER): Payer: Self-pay | Admitting: Nurse Practitioner

## 2020-02-18 VITALS — BP 112/72 | HR 85 | Temp 98.6°F | Ht 69.5 in | Wt 206.0 lb

## 2020-02-18 DIAGNOSIS — Z0000 Encounter for general adult medical examination without abnormal findings: Secondary | ICD-10-CM

## 2020-02-18 DIAGNOSIS — F17220 Nicotine dependence, chewing tobacco, uncomplicated: Secondary | ICD-10-CM

## 2020-02-18 DIAGNOSIS — F325 Major depressive disorder, single episode, in full remission: Secondary | ICD-10-CM

## 2020-02-18 DIAGNOSIS — K219 Gastro-esophageal reflux disease without esophagitis: Secondary | ICD-10-CM

## 2020-02-18 MED ORDER — OMEPRAZOLE 20 MG PO CPDR: DELAYED_RELEASE_CAPSULE | ORAL | 3 refills | Status: DC

## 2020-02-18 NOTE — Progress Notes (Addendum)
BP 112/72   Pulse 85   Temp 98.6 F (37 C) (Oral)   Ht 5' 9.5" (1.765 m)   Wt 206 lb (93.4 kg)   SpO2 97%   BMI 29.98 kg/m    Subjective:    Patient ID: Rick Mendez, male    DOB: 09/28/1976, 44 y.o.   MRN: [id number]  HPI: Rick Mendez is a 44 y.o. male presenting on 02/18/2020 for comprehensive medical examination. Current medical complaints include:none  He currently lives with: wife Interim Problems from his last visit: no   GERD Has stopped taking prescription Prilosec, takes [employer] brand and only as needed.  Takes once every few weeks.  Is a smoker, smokes 1/2 to 1 PPD.  Not interested in quitting. GERD control status: stableSatisfied with current treatment? yes Heartburn frequency: once every few weeks Medication side effects: no  Medication compliance: stable Previous GERD medications: none Dysphagia: no Odynophagia:  no Hematemesis: no Blood in stool: no EGD: no  DEPRESSION Stopped taking Lexapro a long while ago, he does not recall when. Stable without this. Mood status: stable Satisfied with current treatment?: yes Depressed mood: no Anxious mood: no Anhedonia: no Significant weight loss or gain: no Insomnia: none Fatigue: no Feelings of worthlessness or guilt: no Impaired concentration/indecisiveness: no Suicidal ideations: no Hopelessness: no Crying spells: no Depression screen [facility] 2/9 02/18/2020 06/22/2018 01/04/2018 12/28/2017 12/19/2017  Decreased Interest 0 0 _0 Down, Depressed, Hopeless 0 0 _1 PHQ - 2 Score 0 0 _2 Altered sleeping 0 0 _3 Tired, decreased energy 0 _4 Change in appetite 0 0 _5 Feeling bad or failure about yourself  0 0 _6 Trouble concentrating 0 0 _7 Moving slowly or fidgety/restless 0 0 _8 Suicidal thoughts 0 0 _9 PHQ-9 Score 0 _10 Difficult doing work/chores Not difficult at all - - - -    Functional Status Survey: Is the patient deaf or have difficulty hearing?:  No Does the patient have difficulty seeing, even when wearing glasses/contacts?: No Does the patient have difficulty concentrating, remembering, or making decisions?: No Does the patient have difficulty walking or climbing stairs?: No Does the patient have difficulty dressing or bathing?: No Does the patient have difficulty doing errands alone such as visiting a doctor's office or shopping?: No  FALL RISK: Fall Risk  02/18/2020 06/22/2018 10/19/2017 10/01/2015  Falls in the past year? 0 No No No  Number falls in past yr: 0 - - -  Injury with Fall? 0 - - -  Follow up Falls evaluation completed - - -    Advanced Directives <no information>  Past Medical History:  Past Medical History:  Diagnosis Date  . Concussion     Surgical History:  History reviewed. No pertinent surgical history.  Medications:  Current Outpatient Medications on File Prior to Visit  Medication Sig  . sildenafil (REVATIO) 20 MG tablet TAKE 1-5 TABLETS AS NEEDED (Patient not taking: Reported on 02/18/2020)   No current facility-administered medications on file prior to visit.    Allergies:  No Known Allergies  Social History:  Social History   Socioeconomic History  . Marital status: Married    Spouse name: Not on file  . Number of children: Not on file  . Years of education: Not on file  . Highest education  level: Not on file  Occupational History  . Not on file  Tobacco Use  . Smoking status: Current Every Day Smoker    Packs/day: 1.50    Years: 19.00    Pack years: 28.50    Types: Cigarettes  . Smokeless tobacco: Never Used  Substance and Sexual Activity  . Alcohol use: Yes    Comment: social drinker  . Drug use: No  . Sexual activity: Yes  Other Topics Concern  . Not on file  Social History Narrative  . Not on file   Social Determinants of Health   Financial Resource Strain:   . Difficulty of Paying Living Expenses:   Food Insecurity:   . Worried About Charity fundraiser in the  Last Year:   . Arboriculturist in the Last Year:   Transportation Needs:   . Film/video editor (Medical):   Marland Kitchen Lack of Transportation (Non-Medical):   Physical Activity:   . Days of Exercise per Week:   . Minutes of Exercise per Session:   Stress:   . Feeling of Stress :   Social Connections:   . Frequency of Communication with Friends and Family:   . Frequency of Social Gatherings with Friends and Family:   . Attends Religious Services:   . Active Member of Clubs or Organizations:   . Attends Archivist Meetings:   Marland Kitchen Marital Status:   Intimate Partner Violence:   . Fear of Current or Ex-Partner:   . Emotionally Abused:   Marland Kitchen Physically Abused:   . Sexually Abused:    Social History   Tobacco Use  Smoking Status Current Every Day Smoker  . Packs/day: 1.50  . Years: 19.00  . Pack years: 28.50  . Types: Cigarettes  Smokeless Tobacco Never Used   Social History   Substance and Sexual Activity  Alcohol Use Yes   Comment: social drinker    Family History:  Family History  Problem Relation Age of Onset  . Diabetes Father   . Mental illness Father   . Dementia Maternal Grandmother   . Heart disease Maternal Grandfather        MI  . Cancer Paternal Grandfather        prostate    Past medical history, surgical history, medications, allergies, family history and social history reviewed with patient today and changes made to appropriate areas of the chart.   Review of Systems - negative All other ROS negative except what is listed above and in the HPI.      Objective:    BP 112/72   Pulse 85   Temp 98.6 F (37 C) (Oral)   Ht 5' 9.5" (1.765 m)   Wt 206 lb (93.4 kg)   SpO2 97%   BMI 29.98 kg/m   Wt Readings from Last 3 Encounters:  02/18/20 206 lb (93.4 kg)  10/23/18 207 lb (93.9 kg)  06/22/18 201 lb (91.2 kg)    Physical Exam Vitals and nursing note reviewed.  Constitutional:      General: He is awake. He is not in acute distress.     Appearance: He is well-developed. He is not ill-appearing.  HENT:     Head: Normocephalic and atraumatic.     Right Ear: Hearing, tympanic membrane, ear canal and external ear normal. No drainage.     Left Ear: Hearing, tympanic membrane, ear canal and external ear normal. No drainage.     Nose: Nose normal.  Mouth/Throat:     Pharynx: Uvula midline.  Eyes:     General: Lids are normal.        Right eye: No discharge.        Left eye: No discharge.     Extraocular Movements: Extraocular movements intact.     Conjunctiva/sclera: Conjunctivae normal.     Pupils: Pupils are equal, round, and reactive to light.     Visual Fields: Right eye visual fields normal and left eye visual fields normal.  Neck:     Thyroid: No thyromegaly.     Vascular: No carotid bruit or JVD.     Trachea: Trachea normal.  Cardiovascular:     Rate and Rhythm: Normal rate and regular rhythm.     Heart sounds: Normal heart sounds, S1 normal and S2 normal. No murmur. No gallop.   Pulmonary:     Effort: Pulmonary effort is normal. No accessory muscle usage or respiratory distress.     Breath sounds: Normal breath sounds.  Abdominal:     General: Bowel sounds are normal.     Palpations: Abdomen is soft. There is no hepatomegaly or splenomegaly.     Tenderness: There is no abdominal tenderness.  Genitourinary:    Comments: Deferred per request. Musculoskeletal:        General: Normal range of motion.     Cervical back: Normal range of motion and neck supple.     Right lower leg: No edema.     Left lower leg: No edema.  Lymphadenopathy:     Head:     Right side of head: No submental, submandibular, tonsillar, preauricular or posterior auricular adenopathy.     Left side of head: No submental, submandibular, tonsillar, preauricular or posterior auricular adenopathy.     Cervical: No cervical adenopathy.  Skin:    General: Skin is warm and dry.     Capillary Refill: Capillary refill takes less than 2  seconds.     Findings: No rash.  Neurological:     Mental Status: He is alert and oriented to person, place, and time.     Cranial Nerves: Cranial nerves are intact.     Gait: Gait is intact.     Deep Tendon Reflexes: Reflexes are normal and symmetric.     Reflex Scores:      Brachioradialis reflexes are 2+ on the right side and 2+ on the left side.      Patellar reflexes are 2+ on the right side and 2+ on the left side. Psychiatric:        Attention and Perception: Attention normal.        Mood and Affect: Mood normal.        Speech: Speech normal.        Behavior: Behavior normal. Behavior is cooperative.        Thought Content: Thought content normal.        Cognition and Memory: Cognition normal.        Judgment: Judgment normal.     Results for orders placed or performed in visit on 10/23/18  CBC w/Diff  Result Value Ref Range   WBC 7.0 3.4 - 10.8 x10E3/uL   RBC 4.94 4.14 - 5.80 x10E6/uL   Hemoglobin 15.7 13.0 - 17.7 g/dL   MCV 91 79 - 97 fL   MCH 31.8 26.6 - 33.0 pg   MCHC 35.0 31.5 - 35.7 g/dL   RDW 12.5 12.3 - 15.4 %   Platelets 221 150 - 450  x10E3/uL   Neutrophils 57 Not Estab. %   Lymphs 30 Not Estab. %   Monocytes 7 Not Estab. %   Eos 5 Not Estab. %   Basos 1 Not Estab. %   Neutrophils Absolute 4.0 1.4 - 7.0 x10E3/uL   Lymphocytes Absolute 2.1 0.7 - 3.1 x10E3/uL   Monocytes Absolute 0.5 0.1 - 0.9 x10E3/uL   EOS (ABSOLUTE) 0.4 0.0 - 0.4 x10E3/uL   Basophils Absolute 0.0 0.0 - 0.2 x10E3/uL   Immature Granulocytes 0 Not Estab. %   Immature Grans (Abs) 0.0 0.0 - 0.1 x10E3/uL  Comp Met (CMET)  Result Value Ref Range   Glucose 71 65 - 99 mg/dL   BUN 16 6 - 24 mg/dL   Creatinine, Ser 0.78 0.76 - 1.27 mg/dL   GFR calc non Af Amer 112 >59 mL/min/1.73   GFR calc Af Amer 130 >59 mL/min/1.73   BUN/Creatinine Ratio 21 (H) 9 - 20   Sodium 139 134 - 144 mmol/L   Potassium 4.0 3.5 - 5.2 mmol/L   Chloride 101 96 - 106 mmol/L   CO2 26 20 - 29 mmol/L   Calcium 9.6 8.7  - 10.2 mg/dL   Total Protein 7.2 6.0 - 8.5 g/dL   Albumin 4.7 3.5 - 5.5 g/dL   Globulin, Total 2.5 1.5 - 4.5 g/dL   Albumin/Globulin Ratio 1.9 1.2 - 2.2   Bilirubin Total 0.3 0.0 - 1.2 mg/dL   Alkaline Phosphatase 92 39 - 117 IU/L   AST 17 0 - 40 IU/L   ALT 17 0 - 44 IU/L  Thyroid Panel With TSH  Result Value Ref Range   TSH 1.420 0.450 - 4.500 uIU/mL   T4, Total 8.1 4.5 - 12.0 ug/dL   T3 Uptake Ratio 29 24 - 39 %   Free Thyroxine Index 2.3 1.2 - 4.9  Anemia panel  Result Value Ref Range   Total Iron Binding Capacity 263 250 - 450 ug/dL   UIBC 178 111 - 343 ug/dL   Iron 85 38 - 169 ug/dL   Iron Saturation 32 15 - 55 %   Vitamin B-12 320 232 - 1,245 pg/mL   Folate, Hemolysate 370.8 Not Estab. ng/mL   Hematocrit 44.8 37.5 - 51.0 %   Folate, RBC 828 >498 ng/mL   Ferritin 203 30 - 400 ng/mL   Retic Ct Pct 1.4 0.6 - 2.6 %  HgB A1c  Result Value Ref Range   Hgb A1c MFr Bld 5.0 4.8 - 5.6 %   Est. average glucose Bld gHb Est-mCnc 97 mg/dL      Assessment & Plan:   Problem List Items Addressed This Visit      Digestive   GERD (gastroesophageal reflux disease)    Chronic, stable with intermittent Prilosec.  Continue current medication regimen and adjust as needed.  Labs today.  Refills sent per his request.      Relevant Medications   omeprazole (PRILOSEC) 20 MG capsule   Other Relevant Orders   Comprehensive metabolic panel   TSH     Other   Nicotine dependence, chewing tobacco, uncomplicated    I have recommended complete cessation of tobacco use. I have discussed various options available for assistance with tobacco cessation including over the counter methods (Nicotine gum, patch and lozenges). We also discussed prescription options (Chantix, Nicotine Inhaler / Nasal Spray). The patient is not interested in pursuing any prescription tobacco cessation options at this time.       Depression, major, single episode,  complete remission (HCC)    Chronic, stable.  Has been  off medication for some time now and reports no issues with mood.  Denies SI/HI.  GAD and PHQ9 == 0.  Restart medication as needed.       Other Visit Diagnoses    Annual physical exam    -  Primary   Annual labs today to include CBC, CMP, TSH, lipid   Relevant Orders   CBC with Differential/Platelet   Lipid Panel w/o Chol/HDL Ratio       Discussed aspirin prophylaxis for myocardial infarction prevention and decision was it was not indicated  LABORATORY TESTING:  Health maintenance labs ordered today as discussed above.   No family history of prostate cancer and no symptoms.  IMMUNIZATIONS:   - Tdap: Tetanus vaccination status reviewed: last tetanus booster within 10 years. - Influenza: Refused - Pneumovax: Not applicable - Prevnar: Not applicable - Zostavax vaccine: Not applicable  SCREENING: - Colonoscopy: Not applicable  Discussed with patient purpose of the colonoscopy is to detect colon cancer at curable precancerous or early stages   - AAA Screening: Not applicable  -Hearing Test: Not applicable  -Spirometry: Not applicable   PATIENT COUNSELING:    Sexuality: Discussed sexually transmitted diseases, partner selection, use of condoms, avoidance of unintended pregnancy  and contraceptive alternatives.   Advised to avoid cigarette smoking.  I discussed with the patient that most people either abstain from alcohol or drink within safe limits (<=14/week and <=4 drinks/occasion for males, <=7/weeks and <= 3 drinks/occasion for females) and that the risk for alcohol disorders and other health effects rises proportionally with the number of drinks per week and how often a drinker exceeds daily limits.  Discussed cessation/primary prevention of drug use and availability of treatment for abuse.   Diet: Encouraged to adjust caloric intake to maintain  or achieve ideal body weight, to reduce intake of dietary saturated fat and total fat, to limit sodium intake by avoiding high  sodium foods and not adding table salt, and to maintain adequate dietary potassium and calcium preferably from fresh fruits, vegetables, and low-fat dairy products.    stressed the importance of regular exercise  Injury prevention: Discussed safety belts, safety helmets, smoke detector, smoking near bedding or upholstery.   Dental health: Discussed importance of regular tooth brushing, flossing, and dental visits.   Follow up plan: NEXT PREVENTATIVE PHYSICAL DUE IN 1 YEAR. Return in about 1 year (around 02/17/2021) for Annual physical.

## 2020-02-18 NOTE — Assessment & Plan Note (Signed)
I have recommended complete cessation of tobacco use. I have discussed various options available for assistance with tobacco cessation including over the counter methods (Nicotine gum, patch and lozenges). We also discussed prescription options (Chantix, Nicotine Inhaler / Nasal Spray). The patient is not interested in pursuing any prescription tobacco cessation options at this time.  

## 2020-02-18 NOTE — Patient Instructions (Signed)
 Health Maintenance, Male Adopting a healthy lifestyle and getting preventive care are important in promoting health and wellness. Ask your health care provider about:  The right schedule for you to have regular tests and exams.  Things you can do on your own to prevent diseases and keep yourself healthy. What should I know about diet, weight, and exercise? Eat a healthy diet   Eat a diet that includes plenty of vegetables, fruits, low-fat dairy products, and lean protein.  Do not eat a lot of foods that are high in solid fats, added sugars, or sodium. Maintain a healthy weight Body mass index (BMI) is a measurement that can be used to identify possible weight problems. It estimates body fat based on height and weight. Your health care provider can help determine your BMI and help you achieve or maintain a healthy weight. Get regular exercise Get regular exercise. This is one of the most important things you can do for your health. Most adults should:  Exercise for at least 150 minutes each week. The exercise should increase your heart rate and make you sweat (moderate-intensity exercise).  Do strengthening exercises at least twice a week. This is in addition to the moderate-intensity exercise.  Spend less time sitting. Even light physical activity can be beneficial. Watch cholesterol and blood lipids Have your blood tested for lipids and cholesterol at 43 years of age, then have this test every 5 years. You may need to have your cholesterol levels checked more often if:  Your lipid or cholesterol levels are high.  You are older than 43 years of age.  You are at high risk for heart disease. What should I know about cancer screening? Many types of cancers can be detected early and may often be prevented. Depending on your health history and family history, you may need to have cancer screening at various ages. This may include screening for:  Colorectal cancer.  Prostate  cancer.  Skin cancer.  Lung cancer. What should I know about heart disease, diabetes, and high blood pressure? Blood pressure and heart disease  High blood pressure causes heart disease and increases the risk of stroke. This is more likely to develop in people who have high blood pressure readings, are of African descent, or are overweight.  Talk with your health care provider about your target blood pressure readings.  Have your blood pressure checked: ? Every 3-5 years if you are 43-43 years of age. ? Every year if you are 40 years old or older.  If you are between the ages of 65 and 75 and are a current or former smoker, ask your health care provider if you should have a one-time screening for abdominal aortic aneurysm (AAA). Diabetes Have regular diabetes screenings. This checks your fasting blood sugar level. Have the screening done:  Once every three years after age 43 if you are at a normal weight and have a low risk for diabetes.  More often and at a younger age if you are overweight or have a high risk for diabetes. What should I know about preventing infection? Hepatitis B If you have a higher risk for hepatitis B, you should be screened for this virus. Talk with your health care provider to find out if you are at risk for hepatitis B infection. Hepatitis C Blood testing is recommended for:  Everyone born from 1945 through 1965.  Anyone with known risk factors for hepatitis C. Sexually transmitted infections (STIs)  You should be screened each   year for STIs, including gonorrhea and chlamydia, if: ? You are sexually active and are younger than 43 years of age. ? You are older than 43 years of age and your health care provider tells you that you are at risk for this type of infection. ? Your sexual activity has changed since you were last screened, and you are at increased risk for chlamydia or gonorrhea. Ask your health care provider if you are at risk.  Ask your  health care provider about whether you are at high risk for HIV. Your health care provider may recommend a prescription medicine to help prevent HIV infection. If you choose to take medicine to prevent HIV, you should first get tested for HIV. You should then be tested every 3 months for as long as you are taking the medicine. Follow these instructions at home: Lifestyle  Do not use any products that contain nicotine or tobacco, such as cigarettes, e-cigarettes, and chewing tobacco. If you need help quitting, ask your health care provider.  Do not use street drugs.  Do not share needles.  Ask your health care provider for help if you need support or information about quitting drugs. Alcohol use  Do not drink alcohol if your health care provider tells you not to drink.  If you drink alcohol: ? Limit how much you have to 0-2 drinks a day. ? Be aware of how much alcohol is in your drink. In the [place] one drink equals one 12 oz bottle of beer (355 mL), one 5 oz glass of wine (148 mL), or one 1 oz glass of hard liquor (44 mL). General instructions  Schedule regular health, dental, and eye exams.  Stay current with your vaccines.  Tell your health care provider if: ? You often feel depressed. ? You have ever been abused or do not feel safe at home. Summary  Adopting a healthy lifestyle and getting preventive care are important in promoting health and wellness.  Follow your health care provider's instructions about healthy diet, exercising, and getting tested or screened for diseases.  Follow your health care provider's instructions on monitoring your cholesterol and blood pressure. This information is not intended to replace advice given to you by your health care provider. Make sure you discuss any questions you have with your health care provider. Document Revised: 11/01/2018 Document Reviewed: 11/01/2018 Elsevier Patient Education  2020 [employer].  American Heart Association  (AHA) Exercise Recommendation  Being physically active is important to prevent heart disease and stroke, the nation's No. 1and No. 5killers. To improve overall cardiovascular health, we suggest at least 150 minutes per week of moderate exercise or 75 minutes per week of vigorous exercise (or a combination of moderate and vigorous activity). Thirty minutes a day, five times a week is an easy goal to remember. You will also experience benefits even if you divide your time into two or three segments of 10 to 15 minutes per day.  For people who would benefit from lowering their blood pressure or cholesterol, we recommend 40 minutes of aerobic exercise of moderate to vigorous intensity three to four times a week to lower the risk for heart attack and stroke.  Physical activity is anything that makes you move your body and burn calories.  This includes things like climbing stairs or playing sports. Aerobic exercises benefit your heart, and include walking, jogging, swimming or biking. Strength and stretching exercises are best for overall stamina and flexibility.  The simplest, positive change you can   make to effectively improve your heart health is to start walking. It's enjoyable, free, easy, social and great exercise. A walking program is flexible and boasts high success rates because people can stick with it. It's easy for walking to become a regular and satisfying part of life.   For Overall Cardiovascular Health:  At least 30 minutes of moderate-intensity aerobic activity at least 5 days per week for a total of 150  OR   At least 25 minutes of vigorous aerobic activity at least 3 days per week for a total of 75 minutes; or a combination of moderate- and vigorous-intensity aerobic activity  AND   Moderate- to high-intensity muscle-strengthening activity at least 2 days per week for additional health benefits.  For Lowering Blood Pressure and Cholesterol  An average 40 minutes of moderate-  to vigorous-intensity aerobic activity 3 or 4 times per week  What if I can't make it to the time goal? Something is always better than nothing! And everyone has to start somewhere. Even if you've been sedentary for years, today is the day you can begin to make healthy changes in your life. If you don't think you'll make it for 30 or 40 minutes, set a reachable goal for today. You can work up toward your overall goal by increasing your time as you get stronger. Don't let all-or-nothing thinking rob you of doing what you can every day.  Source:http://www.heart.org    

## 2020-02-18 NOTE — Assessment & Plan Note (Addendum)
Chronic, stable with intermittent Prilosec.  Continue current medication regimen and adjust as needed.  Labs today.  Refills sent per his request.

## 2020-02-18 NOTE — Addendum Note (Signed)
Addended by: Aura Dials T on: 02/18/2020 04:02 PM   Modules accepted: Orders

## 2020-02-18 NOTE — Assessment & Plan Note (Signed)
Chronic, stable.  Has been off medication for some time now and reports no issues with mood.  Denies SI/HI.  GAD and PHQ9 == 0.  Restart medication as needed.

## 2020-02-19 LAB — LIPID PANEL W/O CHOL/HDL RATIO
Cholesterol, Total: 166 mg/dL (ref 100–199)
HDL: 35 mg/dL — ABNORMAL LOW
LDL Chol Calc (NIH): 108 mg/dL — ABNORMAL HIGH (ref 0–99)
Triglycerides: 129 mg/dL (ref 0–149)
VLDL Cholesterol Cal: 23 mg/dL (ref 5–40)

## 2020-02-19 LAB — COMPREHENSIVE METABOLIC PANEL WITH GFR
ALT: 19 [IU]/L (ref 0–44)
AST: 20 [IU]/L (ref 0–40)
Albumin/Globulin Ratio: 1.7 (ref 1.2–2.2)
Albumin: 4.7 g/dL (ref 4.0–5.0)
Alkaline Phosphatase: 98 [IU]/L (ref 39–117)
BUN/Creatinine Ratio: 14 (ref 9–20)
BUN: 14 mg/dL (ref 6–24)
Bilirubin Total: 0.5 mg/dL (ref 0.0–1.2)
CO2: 22 mmol/L (ref 20–29)
Calcium: 9.7 mg/dL (ref 8.7–10.2)
Chloride: 103 mmol/L (ref 96–106)
Creatinine, Ser: 0.99 mg/dL (ref 0.76–1.27)
GFR calc Af Amer: 108 mL/min/{1.73_m2}
GFR calc non Af Amer: 94 mL/min/{1.73_m2}
Globulin, Total: 2.7 g/dL (ref 1.5–4.5)
Glucose: 71 mg/dL (ref 65–99)
Potassium: 4.4 mmol/L (ref 3.5–5.2)
Sodium: 139 mmol/L (ref 134–144)
Total Protein: 7.4 g/dL (ref 6.0–8.5)

## 2020-02-19 LAB — CBC WITH DIFFERENTIAL/PLATELET
Basophils Absolute: 0 10*3/uL (ref 0.0–0.2)
Basos: 1 %
EOS (ABSOLUTE): 0.3 10*3/uL (ref 0.0–0.4)
Eos: 4 %
Hematocrit: 49.4 % (ref 37.5–51.0)
Hemoglobin: 17.4 g/dL (ref 13.0–17.7)
Immature Grans (Abs): 0 10*3/uL (ref 0.0–0.1)
Immature Granulocytes: 0 %
Lymphocytes Absolute: 1.9 10*3/uL (ref 0.7–3.1)
Lymphs: 25 %
MCH: 32.7 pg (ref 26.6–33.0)
MCHC: 35.2 g/dL (ref 31.5–35.7)
MCV: 93 fL (ref 79–97)
Monocytes Absolute: 0.5 10*3/uL (ref 0.1–0.9)
Monocytes: 7 %
Neutrophils Absolute: 4.8 10*3/uL (ref 1.4–7.0)
Neutrophils: 63 %
Platelets: 217 10*3/uL (ref 150–450)
RBC: 5.32 x10E6/uL (ref 4.14–5.80)
RDW: 12.3 % (ref 11.6–15.4)
WBC: 7.6 10*3/uL (ref 3.4–10.8)

## 2020-02-19 LAB — TSH: TSH: 1.59 u[IU]/mL (ref 0.450–4.500)

## 2020-02-19 NOTE — Progress Notes (Signed)
Contacted via MyChart The 10-year ASCVD risk score Denman George DC Jr., et al., 2013) is: 3.9%   Values used to calculate the score:     Age: 44 years     Sex: Male     Is Non-Hispanic African American: No     Diabetic: No     Tobacco smoker: Yes     Systolic Blood Pressure: 112 mmHg     Is BP treated: No     HDL Cholesterol: 35 mg/dL     Total Cholesterol: 166 mg/dL

## 2020-07-21 ENCOUNTER — Other Ambulatory Visit: Payer: Self-pay | Admitting: Nurse Practitioner

## 2020-07-21 NOTE — Telephone Encounter (Signed)
Medication Refill - Medication: omeprazole, Cialis   Has the patient contacted their pharmacy? Yes.   (Agent: If no, request that the patient contact the pharmacy for the refill.) (Agent: If yes, when and what did the pharmacy advise?)  Preferred Pharmacy (with phone number or street name):  [facility] [address] [place] - [address]  [address] [place] [address]  Phone: [phone] Fax: [phone]  Hours: Not open 24 hours    Agent: Please be advised that RX refills may take up to 3 business days. We ask that you follow-up with your pharmacy.

## 2020-07-29 ENCOUNTER — Other Ambulatory Visit: Payer: Self-pay | Admitting: Nurse Practitioner

## 2020-07-29 MED ORDER — SILDENAFIL CITRATE 20 MG PO TABS: ORAL_TABLET | ORAL | 4 refills | Status: DC

## 2020-07-29 MED ORDER — OMEPRAZOLE 20 MG PO CPDR: DELAYED_RELEASE_CAPSULE | ORAL | 4 refills | Status: DC

## 2020-12-16 ENCOUNTER — Ambulatory Visit: Payer: Self-pay | Admitting: Nurse Practitioner

## 2020-12-16 ENCOUNTER — Other Ambulatory Visit: Payer: Self-pay

## 2020-12-16 ENCOUNTER — Encounter: Payer: Self-pay | Admitting: Nurse Practitioner

## 2020-12-16 VITALS — BP 111/73 | HR 80 | Temp 98.0°F | Ht 69.49 in | Wt 214.0 lb

## 2020-12-16 DIAGNOSIS — B353 Tinea pedis: Secondary | ICD-10-CM | POA: Insufficient documentation

## 2020-12-16 MED ORDER — KETOCONAZOLE 2 % EX CREA: 1.0000 "application " | TOPICAL_CREAM | Freq: Every day | CUTANEOUS | 0 refills | Status: DC

## 2020-12-16 MED ORDER — FLUCONAZOLE 150 MG PO TABS: 150.0000 mg | ORAL_TABLET | Freq: Once | ORAL | 0 refills | Status: AC

## 2020-12-16 NOTE — Patient Instructions (Signed)
Athlete's Foot  Athlete's foot (tinea pedis) is a fungal infection of the skin on your feet. It often occurs on the skin that is between or underneath your toes. It can also occur on the soles of your feet. Symptoms include itchy or white and flaky areas on the skin. The infection can spread from person to person (is contagious). It can also spread when a person's bare feet come in contact with the fungus on shower floors or on items such as shoes. Follow these instructions at home: Medicines  Apply or take over-the-counter and prescription medicines only as told by your doctor.  Apply your antifungal medicine as told by your doctor. Do not stop using the medicine even if your feet start to get better. Foot care  Do not scratch your feet.  Keep your feet dry: ? Wear cotton or wool socks. Change your socks every day or if they become wet. ? Wear shoes that allow air to move around, such as sandals or canvas tennis shoes.  Wash and dry your feet: ? Every day or as told by your doctor. ? After exercising. ? Including the area between your toes. General instructions  Do not share any of these items that touch your feet: ? Towels. ? Shoes. ? Nail clippers. ? Other personal items.  Protect your feet by wearing sandals in wet areas, such as locker rooms and shared showers.  Keep all follow-up visits as told by your doctor. This is important.  If you have diabetes, keep your blood sugar under control. Contact a doctor if:  You have a fever.  You have swelling, pain, warmth, or redness in your foot.  Your feet are not getting better with treatment.  Your symptoms get worse.  You have new symptoms. Summary  Athlete's foot is a fungal infection of the skin on your feet.  Symptoms include itchy or white and flaky areas on the skin.  Apply your antifungal medicine as told by your doctor.  Keep your feet clean and dry. This information is not intended to replace advice given  to you by your health care provider. Make sure you discuss any questions you have with your health care provider. Document Revised: 06/26/2020 Document Reviewed: 06/26/2020 Elsevier Patient Education  2021 [employer] Inc.  

## 2020-12-16 NOTE — Progress Notes (Signed)
BP 111/73   Pulse 80   Temp 98 F (36.7 C) (Oral)   Ht 5' 9.49" (1.765 m)   Wt 214 lb (97.1 kg)   SpO2 98%   BMI 31.16 kg/m    Subjective:    Patient ID: Rick Mendez, male    DOB: [birth year], 45 y.o.   MRN: [id number]  HPI: Rick Mendez is a 45 y.o. male  Chief Complaint  Patient presents with  . athlete's foot    Patient states this has been going on for about 3 months on left foot   FOOT PAIN Present to left foot x 3-4 months.  Does go fishing and wears muck boots often for this. Duration: months Involved foot: left Mechanism of injury: unknown Location: top left foot Onset: sudden  Severity: 5/10  Quality:  burning and throbbing Frequency: intermittent Radiation: no Aggravating factors: walking and movement  Alleviating factors: Peroxide  Status: worse Treatments attempted: Lamisil, lotrimine, sprays, alcohol Relief with NSAIDs?:  No NSAIDs Taken Weakness with weight bearing or walking: no Morning stiffness: no Swelling: yes on weekend Redness: yes  Bruising: no Paresthesias / decreased sensation: no  Fevers:no  Relevant past medical, surgical, family and social history reviewed and updated as indicated. Interim medical history since our last visit reviewed. Allergies and medications reviewed and updated.  Review of Systems  Constitutional: Negative for activity change, diaphoresis, fatigue and fever.  Respiratory: Negative for cough, chest tightness, shortness of breath and wheezing.   Cardiovascular: Negative for chest pain, palpitations and leg swelling.  Gastrointestinal: Negative.   Neurological: Negative.   Psychiatric/Behavioral: Negative.     Per HPI unless specifically indicated above     Objective:    BP 111/73   Pulse 80   Temp 98 F (36.7 C) (Oral)   Ht 5' 9.49" (1.765 m)   Wt 214 lb (97.1 kg)   SpO2 98%   BMI 31.16 kg/m   Wt Readings from Last 3 Encounters:  12/16/20 214 lb (97.1 kg)  02/18/20 206 lb (93.4 kg)   10/23/18 207 lb (93.9 kg)    Physical Exam Vitals and nursing note reviewed.  Constitutional:      General: He is awake. He is not in acute distress.    Appearance: He is well-developed and well-groomed. He is obese. He is not ill-appearing.  HENT:     Head: Normocephalic and atraumatic.     Right Ear: Hearing normal. No drainage.     Left Ear: Hearing normal. No drainage.  Eyes:     General: Lids are normal.        Right eye: No discharge.        Left eye: No discharge.     Conjunctiva/sclera: Conjunctivae normal.     Pupils: Pupils are equal, round, and reactive to light.  Neck:     Vascular: No carotid bruit.     Trachea: Trachea normal.  Cardiovascular:     Rate and Rhythm: Normal rate and regular rhythm.     Pulses:          Dorsalis pedis pulses are 2+ on the right side and 2+ on the left side.       Posterior tibial pulses are 2+ on the right side and 2+ on the left side.     Heart sounds: Normal heart sounds, S1 normal and S2 normal. No murmur heard. No gallop.   Pulmonary:     Effort: Pulmonary effort is normal. No accessory muscle usage  or respiratory distress.     Breath sounds: Normal breath sounds.  Abdominal:     General: Bowel sounds are normal.     Palpations: Abdomen is soft. There is no hepatomegaly or splenomegaly.  Musculoskeletal:     Cervical back: Normal range of motion and neck supple.     Right lower leg: No edema.     Left lower leg: No edema.     Right foot: Normal range of motion.     Left foot: Normal range of motion.       Feet:  Feet:     Right foot:     Skin integrity: Skin integrity normal.     Toenail Condition: Right toenails are normal.     Left foot:     Skin integrity: Skin breakdown, erythema, dry skin and fissure present. No warmth.     Toenail Condition: Left toenails are normal.  Skin:    General: Skin is warm and dry.     Capillary Refill: Capillary refill takes less than 2 seconds.     Findings: No rash.  Neurological:      Mental Status: He is alert and oriented to person, place, and time.     Deep Tendon Reflexes: Reflexes are normal and symmetric.  Psychiatric:        Attention and Perception: Attention normal.        Mood and Affect: Mood normal.        Behavior: Behavior normal. Behavior is cooperative.        Thought Content: Thought content normal.     Results for orders placed or performed in visit on 02/18/20  CBC with Differential/Platelet  Result Value Ref Range   WBC 7.6 3.4 - 10.8 x10E3/uL   RBC 5.32 4.14 - 5.80 x10E6/uL   Hemoglobin 17.4 13.0 - 17.7 g/dL   Hematocrit 47.8 29.5 - 51.0 %   MCV 93 79 - 97 fL   MCH 32.7 26.6 - 33.0 pg   MCHC 35.2 31.5 - 35.7 g/dL   RDW 62.1 30.8 - 65.7 %   Platelets 217 150 - 450 x10E3/uL   Neutrophils 63 Not Estab. %   Lymphs 25 Not Estab. %   Monocytes 7 Not Estab. %   Eos 4 Not Estab. %   Basos 1 Not Estab. %   Neutrophils Absolute 4.8 1.4 - 7.0 x10E3/uL   Lymphocytes Absolute 1.9 0.7 - 3.1 x10E3/uL   Monocytes Absolute 0.5 0.1 - 0.9 x10E3/uL   EOS (ABSOLUTE) 0.3 0.0 - 0.4 x10E3/uL   Basophils Absolute 0.0 0.0 - 0.2 x10E3/uL   Immature Granulocytes 0 Not Estab. %   Immature Grans (Abs) 0.0 0.0 - 0.1 x10E3/uL  Comprehensive metabolic panel  Result Value Ref Range   Glucose 71 65 - 99 mg/dL   BUN 14 6 - 24 mg/dL   Creatinine, Ser 8.46 0.76 - 1.27 mg/dL   GFR calc non Af Amer 94 >59 mL/min/1.73   GFR calc Af Amer 108 >59 mL/min/1.73   BUN/Creatinine Ratio 14 9 - 20   Sodium 139 134 - 144 mmol/L   Potassium 4.4 3.5 - 5.2 mmol/L   Chloride 103 96 - 106 mmol/L   CO2 22 20 - 29 mmol/L   Calcium 9.7 8.7 - 10.2 mg/dL   Total Protein 7.4 6.0 - 8.5 g/dL   Albumin 4.7 4.0 - 5.0 g/dL   Globulin, Total 2.7 1.5 - 4.5 g/dL   Albumin/Globulin Ratio 1.7 1.2 - 2.2   Bilirubin  Total 0.5 0.0 - 1.2 mg/dL   Alkaline Phosphatase 98 39 - 117 IU/L   AST 20 0 - 40 IU/L   ALT 19 0 - 44 IU/L  Lipid Panel w/o Chol/HDL Ratio  Result Value Ref Range    Cholesterol, Total 166 100 - 199 mg/dL   Triglycerides 517 0 - 149 mg/dL   HDL 35 (L) >00 mg/dL   VLDL Cholesterol Cal 23 5 - 40 mg/dL   LDL Chol Calc (NIH) 174 (H) 0 - 99 mg/dL  TSH  Result Value Ref Range   TSH 1.590 0.450 - 4.500 uIU/mL      Assessment & Plan:   Problem List Items Addressed This Visit      Musculoskeletal and Integument   Tinea pedis of left foot - Primary    Acute for 3-4 months with worsening.  Will obtain labs today to include CMP and CBC.  Start Ketoconazole cream daily and x one dose Fluconazole sent in.  Referral to podiatry sent in.  Suspect he will need oral medication, Terbinafine, for treatment.  Recommend keeping feet dry and monitoring closely for worsening breakdown.  Return in 4 weeks.      Relevant Medications   fluconazole (DIFLUCAN) 150 MG tablet   ketoconazole (NIZORAL) 2 % cream   Other Relevant Orders   Comprehensive metabolic panel   CBC with Differential/Platelet   Ambulatory referral to Podiatry       Follow up plan: Return in about 4 weeks (around 01/13/2021) for Athlete's foot.

## 2020-12-16 NOTE — Assessment & Plan Note (Signed)
Acute for 3-4 months with worsening.  Will obtain labs today to include CMP and CBC.  Start Ketoconazole cream daily and x one dose Fluconazole sent in.  Referral to podiatry sent in.  Suspect he will need oral medication, Terbinafine, for treatment.  Recommend keeping feet dry and monitoring closely for worsening breakdown.  Return in 4 weeks.

## 2020-12-17 LAB — COMPREHENSIVE METABOLIC PANEL
ALT: 19 IU/L (ref 0–44)
AST: 21 IU/L (ref 0–40)
Albumin/Globulin Ratio: 1.7 (ref 1.2–2.2)
Albumin: 4.3 g/dL (ref 4.0–5.0)
Alkaline Phosphatase: 90 IU/L (ref 44–121)
BUN/Creatinine Ratio: 13 (ref 9–20)
BUN: 12 mg/dL (ref 6–24)
Bilirubin Total: 0.5 mg/dL (ref 0.0–1.2)
CO2: 21 mmol/L (ref 20–29)
Calcium: 9.6 mg/dL (ref 8.7–10.2)
Chloride: 106 mmol/L (ref 96–106)
Creatinine, Ser: 0.9 mg/dL (ref 0.76–1.27)
GFR calc Af Amer: 121 mL/min/{1.73_m2} (ref >59)
GFR calc non Af Amer: 104 mL/min/{1.73_m2} (ref >59)
Globulin, Total: 2.5 g/dL (ref 1.5–4.5)
Glucose: 86 mg/dL (ref 65–99)
Potassium: 4.5 mmol/L (ref 3.5–5.2)
Sodium: 143 mmol/L (ref 134–144)
Total Protein: 6.8 g/dL (ref 6.0–8.5)

## 2020-12-17 LAB — CBC WITH DIFFERENTIAL/PLATELET
Basophils Absolute: 0 10*3/uL (ref 0.0–0.2)
Basos: 0 %
EOS (ABSOLUTE): 0.2 10*3/uL (ref 0.0–0.4)
Eos: 3 %
Hematocrit: 45.6 % (ref 37.5–51.0)
Hemoglobin: 16.3 g/dL (ref 13.0–17.7)
Immature Grans (Abs): 0 10*3/uL (ref 0.0–0.1)
Immature Granulocytes: 0 %
Lymphocytes Absolute: 1.3 10*3/uL (ref 0.7–3.1)
Lymphs: 18 %
MCH: 32.3 pg (ref 26.6–33.0)
MCHC: 35.7 g/dL (ref 31.5–35.7)
MCV: 90 fL (ref 79–97)
Monocytes Absolute: 0.3 10*3/uL (ref 0.1–0.9)
Monocytes: 5 %
Neutrophils Absolute: 5.3 10*3/uL (ref 1.4–7.0)
Neutrophils: 74 %
Platelets: 198 10*3/uL (ref 150–450)
RBC: 5.05 x10E6/uL (ref 4.14–5.80)
RDW: 12.5 % (ref 11.6–15.4)
WBC: 7.3 10*3/uL (ref 3.4–10.8)

## 2020-12-17 NOTE — Progress Notes (Signed)
Contacted via MyChart   Good evening Rick Mendez, your labs have returned and CBC is normal.  Kidney and liver function normal.  With normal liver function this means we can use some of the oral, stronger medications if needed in future for your athlete's foot.  Have you heard from podiatry?  How are you doing? Keep being awesome!!  Thank you for allowing me to participate in your care. Kindest regards, Jelani Trueba

## 2020-12-22 ENCOUNTER — Other Ambulatory Visit: Payer: Self-pay | Admitting: Nurse Practitioner

## 2020-12-22 DIAGNOSIS — Z5181 Encounter for therapeutic drug level monitoring: Secondary | ICD-10-CM

## 2020-12-22 MED ORDER — TERBINAFINE HCL 250 MG PO TABS: 250.0000 mg | ORAL_TABLET | Freq: Every day | ORAL | 0 refills | Status: DC

## 2020-12-22 MED ORDER — TERBINAFINE HCL 250 MG PO TABS: 250.0000 mg | ORAL_TABLET | Freq: Every day | ORAL | 0 refills | Status: AC

## 2020-12-24 ENCOUNTER — Ambulatory Visit: Payer: Self-pay | Admitting: Nurse Practitioner

## 2020-12-25 ENCOUNTER — Ambulatory Visit: Payer: Self-pay | Admitting: Podiatry

## 2020-12-31 ENCOUNTER — Ambulatory Visit: Payer: Self-pay | Admitting: Nurse Practitioner

## 2021-01-12 ENCOUNTER — Other Ambulatory Visit: Payer: Self-pay

## 2021-01-12 DIAGNOSIS — Z5181 Encounter for therapeutic drug level monitoring: Secondary | ICD-10-CM

## 2021-01-13 LAB — HEPATIC FUNCTION PANEL
ALT: 23 IU/L (ref 0–44)
AST: 16 IU/L (ref 0–40)
Albumin: 4.6 g/dL (ref 4.0–5.0)
Alkaline Phosphatase: 103 IU/L (ref 44–121)
Bilirubin Total: 0.3 mg/dL (ref 0.0–1.2)
Bilirubin, Direct: <0.1 mg/dL (ref 0.00–0.40)
Total Protein: 7.2 g/dL (ref 6.0–8.5)

## 2021-01-13 NOTE — Progress Notes (Signed)
Contacted via MyChart   Good morning, your labs have returned and everything looks great!! Keep being awesome!!  Thank you for allowing me to participate in your care. Kindest regards, Lizania Bouchard

## 2021-02-18 ENCOUNTER — Encounter: Payer: Self-pay | Admitting: Nurse Practitioner

## 2021-12-14 ENCOUNTER — Encounter: Payer: Self-pay | Admitting: Nurse Practitioner

## 2021-12-15 ENCOUNTER — Encounter: Payer: Self-pay | Admitting: Nurse Practitioner

## 2022-12-04 DIAGNOSIS — E7800 Pure hypercholesterolemia, unspecified: Secondary | ICD-10-CM | POA: Insufficient documentation

## 2022-12-04 NOTE — Patient Instructions (Signed)

## 2022-12-06 ENCOUNTER — Encounter: Payer: Self-pay | Admitting: Nurse Practitioner

## 2022-12-06 ENCOUNTER — Ambulatory Visit (INDEPENDENT_AMBULATORY_CARE_PROVIDER_SITE_OTHER): Payer: Commercial Managed Care - PPO | Admitting: Nurse Practitioner

## 2022-12-06 VITALS — BP 115/83 | HR 97 | Temp 98.3°F | Ht 69.49 in | Wt 215.8 lb

## 2022-12-06 DIAGNOSIS — E7800 Pure hypercholesterolemia, unspecified: Secondary | ICD-10-CM | POA: Diagnosis not present

## 2022-12-06 DIAGNOSIS — K219 Gastro-esophageal reflux disease without esophagitis: Secondary | ICD-10-CM | POA: Diagnosis not present

## 2022-12-06 DIAGNOSIS — F17220 Nicotine dependence, chewing tobacco, uncomplicated: Secondary | ICD-10-CM | POA: Diagnosis not present

## 2022-12-06 DIAGNOSIS — Z0000 Encounter for general adult medical examination without abnormal findings: Secondary | ICD-10-CM | POA: Diagnosis not present

## 2022-12-06 DIAGNOSIS — R7301 Impaired fasting glucose: Secondary | ICD-10-CM

## 2022-12-06 DIAGNOSIS — Z1159 Encounter for screening for other viral diseases: Secondary | ICD-10-CM

## 2022-12-06 DIAGNOSIS — Z23 Encounter for immunization: Secondary | ICD-10-CM | POA: Diagnosis not present

## 2022-12-06 MED ORDER — SILDENAFIL CITRATE 20 MG PO TABS: ORAL_TABLET | ORAL | 4 refills | Status: DC

## 2022-12-06 NOTE — Progress Notes (Signed)
BP 115/83   Pulse 97   Temp 98.3 F (36.8 C) (Oral)   Ht 5' 9.49" (1.765 m)   Wt 215 lb 12.5 oz (97.9 kg)   SpO2 97%   BMI 31.42 kg/m    Subjective:    Patient ID: Rick Mendez, male    DOB: [birth year], 47 y.o.   MRN: [id number]  HPI: Rick Mendez is a 47 y.o. male presenting on 12/06/2022 for comprehensive medical examination. Current medical complaints include:none  He currently lives with: wife Interim Problems from his last visit: no   Would like refills on Revatio.    GERD Has stopped taking prescription Prilosec, takes [employer] brand and only as needed -- one to two times a week.  Is a smoker, smokes 1 1/2  PPD.  Not interested in quitting, he is trying to cut back.  Started smoking in 20's.   GERD control status: stable Satisfied with current treatment? yes Heartburn frequency: once every few weeks Medication side effects: no  Medication compliance: stable Previous GERD medications: none Dysphagia: no Odynophagia:  no Hematemesis: no Blood in stool: no EGD: no     12/06/2022    2:34 PM 12/16/2020    8:49 AM 02/18/2020    3:48 PM 06/22/2018   11:00 AM 01/04/2018    2:55 PM  Depression screen PHQ 2/9  Decreased Interest 0 0 0 0 2  Down, Depressed, Hopeless 0 0 0 0 1  PHQ - 2 Score 0 0 0 0 3  Altered sleeping 0 0 0 0 1  Tired, decreased energy 3 0 0 1 1  Change in appetite 0 0 0 0 2  Feeling bad or failure about yourself  0 0 0 0 1  Trouble concentrating 0 0 0 0 1  Moving slowly or fidgety/restless 2 0 0 0 2  Suicidal thoughts 0 0 0 0 1  PHQ-9 Score 5 0 0 1 12  Difficult doing work/chores Somewhat difficult  Not difficult at all        12/06/2022    2:34 PM 02/18/2020    3:48 PM  GAD 7 : Generalized Anxiety Score  Nervous, Anxious, on Edge 0 0  Control/stop worrying 0 0  Worry too much - different things 0 0  Trouble relaxing 0 0  Restless 1 0  Easily annoyed or irritable 0 0  Afraid - awful might happen 0 0  Total GAD 7 Score 1 0  Anxiety  Difficulty Not difficult at all Not difficult at all   Functional Status Survey: Is the patient deaf or have difficulty hearing?: No Does the patient have difficulty seeing, even when wearing glasses/contacts?: No Does the patient have difficulty concentrating, remembering, or making decisions?: No Does the patient have difficulty walking or climbing stairs?: No Does the patient have difficulty dressing or bathing?: No Does the patient have difficulty doing errands alone such as visiting a doctor's office or shopping?: No  FALL RISK:    12/06/2022    2:34 PM 12/16/2020    8:49 AM 02/18/2020    3:40 PM 06/22/2018   10:10 AM 10/19/2017    2:09 PM  Fall Risk   Falls in the past year? 0 0 0 No No  Number falls in past yr: 0  0    Injury with Fall? 0  0    Risk for fall due to : No Fall Risks      Follow up Falls evaluation completed  Falls evaluation completed  Past Medical History:  Past Medical History:  Diagnosis Date   Concussion     Surgical History:  History reviewed. No pertinent surgical history.  Medications:  Current Outpatient Medications on File Prior to Visit  Medication Sig   omeprazole (PRILOSEC) 20 MG capsule Take 20 mg by mouth daily as needed (for heart burn).   ketoconazole (NIZORAL) 2 % cream Apply 1 application topically daily.   terbinafine (LAMISIL) 250 MG tablet Take 1 tablet (250 mg total) by mouth daily. Stop in 12 weeks.   No current facility-administered medications on file prior to visit.    Allergies:  No Known Allergies  Social History:  Social History   Socioeconomic History   Marital status: Married    Spouse name: Not on file   Number of children: Not on file   Years of education: Not on file   Highest education level: Not on file  Occupational History   Not on file  Tobacco Use   Smoking status: Every Day    Packs/day: 1.50    Years: 19.00    Total pack years: 28.50    Types: Cigarettes   Smokeless tobacco: Never  Vaping  Use   Vaping Use: Never used  Substance and Sexual Activity   Alcohol use: Yes    Comment: social drinker   Drug use: No   Sexual activity: Yes  Other Topics Concern   Not on file  Social History Narrative   Not on file   Social Determinants of Health   Financial Resource Strain: Not on file  Food Insecurity: Not on file  Transportation Needs: Not on file  Physical Activity: Not on file  Stress: Not on file  Social Connections: Not on file  Intimate Partner Violence: Not on file   Social History   Tobacco Use  Smoking Status Every Day   Packs/day: 1.50   Years: 19.00   Total pack years: 28.50   Types: Cigarettes  Smokeless Tobacco Never   Social History   Substance and Sexual Activity  Alcohol Use Yes   Comment: social drinker    Family History:  Family History  Problem Relation Age of Onset   Diabetes Father    Mental illness Father    Dementia Maternal Grandmother    Heart disease Maternal Grandfather        MI   Cancer Paternal Grandfather        prostate    Past medical history, surgical history, medications, allergies, family history and social history reviewed with patient today and changes made to appropriate areas of the chart.   Review of Systems - negative All other ROS negative except what is listed above and in the HPI.      Objective:    BP 115/83   Pulse 97   Temp 98.3 F (36.8 C) (Oral)   Ht 5' 9.49" (1.765 m)   Wt 215 lb 12.5 oz (97.9 kg)   SpO2 97%   BMI 31.42 kg/m   Wt Readings from Last 3 Encounters:  12/06/22 215 lb 12.5 oz (97.9 kg)  12/16/20 214 lb (97.1 kg)  02/18/20 206 lb (93.4 kg)    Physical Exam Vitals and nursing note reviewed.  Constitutional:      General: He is awake. He is not in acute distress.    Appearance: He is well-developed. He is not ill-appearing.  HENT:     Head: Normocephalic and atraumatic.     Right Ear: Hearing, tympanic membrane, ear canal and  external ear normal. No drainage.     Left  Ear: Hearing, tympanic membrane, ear canal and external ear normal. No drainage.     Nose: Nose normal.     Mouth/Throat:     Pharynx: Uvula midline.  Eyes:     General: Lids are normal.        Right eye: No discharge.        Left eye: No discharge.     Extraocular Movements: Extraocular movements intact.     Conjunctiva/sclera: Conjunctivae normal.     Pupils: Pupils are equal, round, and reactive to light.     Visual Fields: Right eye visual fields normal and left eye visual fields normal.  Neck:     Thyroid: No thyromegaly.     Vascular: No carotid bruit or JVD.     Trachea: Trachea normal.  Cardiovascular:     Rate and Rhythm: Normal rate and regular rhythm.     Heart sounds: Normal heart sounds, S1 normal and S2 normal. No murmur heard.    No gallop.  Pulmonary:     Effort: Pulmonary effort is normal. No accessory muscle usage or respiratory distress.     Breath sounds: Normal breath sounds.  Abdominal:     General: Bowel sounds are normal.     Palpations: Abdomen is soft. There is no hepatomegaly or splenomegaly.     Tenderness: There is no abdominal tenderness.  Genitourinary:    Comments: Deferred per request. Musculoskeletal:        General: Normal range of motion.     Cervical back: Normal range of motion and neck supple.     Right lower leg: No edema.     Left lower leg: No edema.  Lymphadenopathy:     Head:     Right side of head: No submental, submandibular, tonsillar, preauricular or posterior auricular adenopathy.     Left side of head: No submental, submandibular, tonsillar, preauricular or posterior auricular adenopathy.     Cervical: No cervical adenopathy.  Skin:    General: Skin is warm and dry.     Capillary Refill: Capillary refill takes less than 2 seconds.     Findings: No rash.  Neurological:     Mental Status: He is alert and oriented to person, place, and time.     Gait: Gait is intact.     Deep Tendon Reflexes: Reflexes are normal and  symmetric.     Reflex Scores:      Brachioradialis reflexes are 2+ on the right side and 2+ on the left side.      Patellar reflexes are 2+ on the right side and 2+ on the left side. Psychiatric:        Attention and Perception: Attention normal.        Mood and Affect: Mood normal.        Speech: Speech normal.        Behavior: Behavior normal. Behavior is cooperative.        Thought Content: Thought content normal.        Cognition and Memory: Cognition normal.        Judgment: Judgment normal.     Results for orders placed or performed in visit on 01/12/21  Hepatic function panel  Result Value Ref Range   Total Protein 7.2 6.0 - 8.5 g/dL   Albumin 4.6 4.0 - 5.0 g/dL   Bilirubin Total 0.3 0.0 - 1.2 mg/dL   Bilirubin, Direct <0.10 0.00 - 0.40 mg/dL  Alkaline Phosphatase 103 44 - 121 IU/L   AST 16 0 - 40 IU/L   ALT 23 0 - 44 IU/L      Assessment & Plan:   Problem List Items Addressed This Visit       Digestive   GERD (gastroesophageal reflux disease)    Chronic, stable with intermittent Prilosec.  Continue current medication regimen and adjust as needed.  Labs today.  Risks of PPI use were discussed with patient including bone loss, C. Diff diarrhea, pneumonia, infections, CKD, electrolyte abnormalities. Verbalizes understanding and chooses to continue the medication.       Relevant Medications   omeprazole (PRILOSEC) 20 MG capsule     Other   Elevated low density lipoprotein (LDL) cholesterol level - Primary    Noted past labs, recheck today and start medication as needed.      Relevant Orders   Comprehensive metabolic panel   Lipid Panel w/o Chol/HDL Ratio   Nicotine dependence, chewing tobacco, uncomplicated    I have recommended complete cessation of tobacco use. I have discussed various options available for assistance with tobacco cessation including over the counter methods (Nicotine gum, patch and lozenges). We also discussed prescription options (Chantix,  Nicotine Inhaler / Nasal Spray). The patient is not interested in pursuing any prescription tobacco cessation options at this time.       Other Visit Diagnoses     IFG (impaired fasting glucose)       History of elevations in sugars.   Relevant Orders   HgB A1c   Need for Td vaccine       Td vaccine in office today.   Relevant Orders   Td vaccine greater than or equal to 7yo preservative free IM   Need for hepatitis C screening test       Hep C screen on labs today per guidelines for one time screening, discussed with patient.   Relevant Orders   Hepatitis C antibody   Encounter for annual physical exam       Annual physical today with labs and health maintenance reviewed, discussed with patient.   Relevant Orders   CBC with Differential/Platelet   TSH        Discussed aspirin prophylaxis for myocardial infarction prevention and decision was it was not indicated  LABORATORY TESTING:  Health maintenance labs ordered today as discussed above.   No family history of prostate cancer and no symptoms.  IMMUNIZATIONS:   - Tdap: Tetanus vaccination status reviewed: provided today - Influenza: Refused - Pneumovax: Not applicable - Prevnar: Not applicable - Zostavax vaccine: Not applicable  SCREENING: - Colonoscopy: will pursue next visit. Discussed with patient purpose of the colonoscopy is to detect colon cancer at curable precancerous or early stages   - AAA Screening: Not applicable  -Hearing Test: Not applicable  -Spirometry: Not applicable   PATIENT COUNSELING:    Sexuality: Discussed sexually transmitted diseases, partner selection, use of condoms, avoidance of unintended pregnancy  and contraceptive alternatives.   Advised to avoid cigarette smoking.  I discussed with the patient that most people either abstain from alcohol or drink within safe limits (<=14/week and <=4 drinks/occasion for males, <=7/weeks and <= 3 drinks/occasion for females) and that the risk  for alcohol disorders and other health effects rises proportionally with the number of drinks per week and how often a drinker exceeds daily limits.  Discussed cessation/primary prevention of drug use and availability of treatment for abuse.   Diet: Encouraged to adjust caloric  intake to maintain  or achieve ideal body weight, to reduce intake of dietary saturated fat and total fat, to limit sodium intake by avoiding high sodium foods and not adding table salt, and to maintain adequate dietary potassium and calcium preferably from fresh fruits, vegetables, and low-fat dairy products.    Stressed the importance of regular exercise  Injury prevention: Discussed safety belts, safety helmets, smoke detector, smoking near bedding or upholstery.   Dental health: Discussed importance of regular tooth brushing, flossing, and dental visits.   Follow up plan: NEXT PREVENTATIVE PHYSICAL DUE IN 1 YEAR. Return in about 1 year (around 12/07/2023) for Annual physical.

## 2022-12-06 NOTE — Assessment & Plan Note (Signed)
Chronic, stable with intermittent Prilosec.  Continue current medication regimen and adjust as needed.  Labs today.  Risks of PPI use were discussed with patient including bone loss, C. Diff diarrhea, pneumonia, infections, CKD, electrolyte abnormalities. Verbalizes understanding and chooses to continue the medication.

## 2022-12-06 NOTE — Assessment & Plan Note (Signed)
I have recommended complete cessation of tobacco use. I have discussed various options available for assistance with tobacco cessation including over the counter methods (Nicotine gum, patch and lozenges). We also discussed prescription options (Chantix, Nicotine Inhaler / Nasal Spray). The patient is not interested in pursuing any prescription tobacco cessation options at this time.  

## 2022-12-06 NOTE — Assessment & Plan Note (Signed)
Noted past labs, recheck today and start medication as needed. 

## 2022-12-07 LAB — CBC WITH DIFFERENTIAL/PLATELET
Basophils Absolute: 0 10*3/uL (ref 0.0–0.2)
Basos: 0 %
EOS (ABSOLUTE): 0.4 10*3/uL (ref 0.0–0.4)
Eos: 5 %
Hematocrit: 47.7 % (ref 37.5–51.0)
Hemoglobin: 16.5 g/dL (ref 13.0–17.7)
Immature Grans (Abs): 0 10*3/uL (ref 0.0–0.1)
Immature Granulocytes: 0 %
Lymphocytes Absolute: 2.1 10*3/uL (ref 0.7–3.1)
Lymphs: 23 %
MCH: 31.4 pg (ref 26.6–33.0)
MCHC: 34.6 g/dL (ref 31.5–35.7)
MCV: 91 fL (ref 79–97)
Monocytes Absolute: 0.5 10*3/uL (ref 0.1–0.9)
Monocytes: 5 %
Neutrophils Absolute: 6 10*3/uL (ref 1.4–7.0)
Neutrophils: 67 %
Platelets: 219 10*3/uL (ref 150–450)
RBC: 5.26 x10E6/uL (ref 4.14–5.80)
RDW: 12.4 % (ref 11.6–15.4)
WBC: 9 10*3/uL (ref 3.4–10.8)

## 2022-12-07 LAB — COMPREHENSIVE METABOLIC PANEL
ALT: 17 IU/L (ref 0–44)
AST: 13 IU/L (ref 0–40)
Albumin/Globulin Ratio: 1.9 (ref 1.2–2.2)
Albumin: 4.5 g/dL (ref 4.1–5.1)
Alkaline Phosphatase: 92 IU/L (ref 44–121)
BUN/Creatinine Ratio: 25 — ABNORMAL HIGH (ref 9–20)
BUN: 21 mg/dL (ref 6–24)
Bilirubin Total: 0.4 mg/dL (ref 0.0–1.2)
CO2: 21 mmol/L (ref 20–29)
Calcium: 10.3 mg/dL — ABNORMAL HIGH (ref 8.7–10.2)
Chloride: 106 mmol/L (ref 96–106)
Creatinine, Ser: 0.84 mg/dL (ref 0.76–1.27)
Globulin, Total: 2.4 g/dL (ref 1.5–4.5)
Glucose: 82 mg/dL (ref 70–99)
Potassium: 4.2 mmol/L (ref 3.5–5.2)
Sodium: 143 mmol/L (ref 134–144)
Total Protein: 6.9 g/dL (ref 6.0–8.5)
eGFR: 110 mL/min/{1.73_m2} (ref >59)

## 2022-12-07 LAB — HEPATITIS C ANTIBODY: Hep C Virus Ab: NONREACTIVE

## 2022-12-07 LAB — LIPID PANEL W/O CHOL/HDL RATIO
Cholesterol, Total: 185 mg/dL (ref 100–199)
HDL: 30 mg/dL — ABNORMAL LOW (ref >39)
LDL Chol Calc (NIH): 114 mg/dL — ABNORMAL HIGH (ref 0–99)
Triglycerides: 236 mg/dL — ABNORMAL HIGH (ref 0–149)
VLDL Cholesterol Cal: 41 mg/dL — ABNORMAL HIGH (ref 5–40)

## 2022-12-07 LAB — HEMOGLOBIN A1C
Est. average glucose Bld gHb Est-mCnc: 100 mg/dL
Hgb A1c MFr Bld: 5.1 % (ref 4.8–5.6)

## 2022-12-07 LAB — TSH: TSH: 2.29 u[IU]/mL (ref 0.450–4.500)

## 2022-12-07 NOTE — Progress Notes (Signed)
Contacted via [place] [facility] [place], your labs have returned: - Kidney function, creatinine and eGFR, remains normal, as is liver function, AST and ALT.  Calcium a smidgen high, but this can be diet related and we can monitor. - CBC is normal, with no anemia or infection. - TSH, thyroid, is normal and A1c shows no diabetes or prediabetes:) - Your LDL is above normal. The LDL is the bad cholesterol. Over time and in combination with inflammation and other factors, this contributes to plaque which in turn may lead to stroke and/or heart attack down the road. Sometimes high LDL is primarily genetic, and people might be eating all the right foods but still have high numbers. Other times, there is room for improvement in one's diet and eating healthier can bring this number down and potentially reduce one's risk of heart attack and/or stroke.   To reduce your LDL, Remember - more fruits and vegetables, more fish, and limit red meat and dairy products. More soy, nuts, beans, barley, lentils, oats and plant sterol ester enriched margarine instead of butter. I also encourage eliminating sugar and processed food. Remember, shop on the outside of the grocery store and visit your [employer]. If you would like to talk with me about dietary changes for your cholesterol, please let me know. We should recheck your cholesterol in 12 months.  Look at the [place] online for more information on cholesterol and diet + recipes:) Any questions? Keep being stellar!!  Thank you for allowing me to participate in your care.  I appreciate you. Kindest regards, Jonella Redditt

## 2023-01-07 ENCOUNTER — Encounter: Payer: Self-pay | Admitting: Nurse Practitioner

## 2023-01-07 MED ORDER — ALBUTEROL SULFATE HFA 108 (90 BASE) MCG/ACT IN AERS: 2.0000 | INHALATION_SPRAY | Freq: Four times a day (QID) | RESPIRATORY_TRACT | 0 refills | Status: AC | PRN

## 2023-01-12 ENCOUNTER — Encounter: Payer: Self-pay | Admitting: Nurse Practitioner

## 2023-01-12 DIAGNOSIS — Z1211 Encounter for screening for malignant neoplasm of colon: Secondary | ICD-10-CM

## 2023-12-05 ENCOUNTER — Ambulatory Visit: Payer: Self-pay

## 2023-12-05 NOTE — Telephone Encounter (Signed)
  Chief Complaint: dizziness Symptoms: dizziness constant with rest or activity, HA, nausea, stomach cramps, diarrhea  Frequency: 2 days  Pertinent Negatives: NA Disposition: [] ED /[] Urgent Care (no appt availability in office) / [x] Appointment(In office/virtual)/ []  [facility]/ [] Home Care/ [] Refused Recommended Disposition /[] [facility]/ []  Follow-up with PCP Additional Notes: pt states had HA other night and woke up next morning with dizziness. Worse in mornings feeling drunk but don't drink and dizziness is constant. HA resolved. Has stomach cramps and nausea that come and go. Doesn't take anything for sx currently. Scheduled OV tomorrow at 0820 with Dr. Vicci. Care advice given and pt verbalized understanding.    Reason for Disposition  [1] MODERATE dizziness (e.g., interferes with normal activities) AND [2] has NOT been evaluated by doctor (or NP/PA) for this  (Exception: Dizziness caused by heat exposure, sudden standing, or poor fluid intake.)  Answer Assessment - Initial Assessment Questions 1. DESCRIPTION: Describe your dizziness.     Dizzy  2. LIGHTHEADED: Do you feel lightheaded? (e.g., somewhat faint, woozy, weak upon standing)     Woozy feeling  4. SEVERITY: How bad is it?  Do you feel like you are going to faint? Can you stand and walk?   - MILD: Feels slightly dizzy, but walking normally.   - MODERATE: Feels unsteady when walking, but not falling; interferes with normal activities (e.g., school, work).   - SEVERE: Unable to walk without falling, or requires assistance to walk without falling; feels like passing out now.      Mild to moderate  5. ONSET:  When did the dizziness begin?     2 days  6. AGGRAVATING FACTORS: Does anything make it worse? (e.g., standing, change in head position)     Constant and with rest or activity  10. OTHER SYMPTOMS: Do you have any other symptoms? (e.g., fever, chest pain, vomiting, diarrhea,  bleeding)       Had HA but resolved, nausea and stomach cramps  Protocols used: Dizziness - Lightheadedness-A-AH

## 2023-12-06 ENCOUNTER — Ambulatory Visit: Payer: Commercial Managed Care - PPO | Admitting: Family Medicine

## 2024-04-29 ENCOUNTER — Telehealth: Admitting: Nurse Practitioner

## 2024-04-29 DIAGNOSIS — H109 Unspecified conjunctivitis: Secondary | ICD-10-CM

## 2024-04-29 DIAGNOSIS — B9689 Other specified bacterial agents as the cause of diseases classified elsewhere: Secondary | ICD-10-CM

## 2024-04-29 MED ORDER — POLYMYXIN B-TRIMETHOPRIM 10000-0.1 UNIT/ML-% OP SOLN: 1.0000 [drp] | OPHTHALMIC | 0 refills | Status: DC

## 2024-04-29 NOTE — Progress Notes (Signed)

## 2024-04-29 NOTE — Progress Notes (Signed)
 I have spent 5 minutes in review of e-visit questionnaire, review and updating patient chart, medical decision making and response to patient.   Claiborne Rigg, NP

## 2024-05-01 ENCOUNTER — Encounter: Payer: Self-pay | Admitting: Nurse Practitioner

## 2024-08-25 NOTE — Patient Instructions (Signed)
 Be Involved in Caring For Your Health:  Taking Medications When medications are taken as directed, they can greatly improve your health. But if they are not taken as prescribed, they may not work. In some cases, not taking them correctly can be harmful. To help ensure your treatment remains effective and safe, understand your medications and how to take them. Bring your medications to each visit for review by your provider.  Your lab results, notes, and after visit summary will be available on My Chart. We strongly encourage you to use this feature. If lab results are abnormal the clinic will contact you with the appropriate steps. If the clinic does not contact you assume the results are satisfactory. You can always view your results on My Chart. If you have questions regarding your health or results, please contact the clinic during office hours. You can also ask questions on My Chart.  We at [facility] are grateful that you chose us  to provide your care. We strive to provide evidence-based and compassionate care and are always looking for feedback. If you get a survey from the clinic please complete this so we can hear your opinions.  Healthy Eating, Adult Healthy eating may help you get and keep a healthy body weight, reduce the risk of chronic disease, and live a long and productive life. It is important to follow a healthy eating pattern. Your nutritional and calorie needs should be met mainly by different nutrient-rich foods. What are tips for following this plan? Reading food labels Read labels and choose the following: Reduced or low sodium products. Juices with 100% fruit juice. Foods with low saturated fats (<3 g per serving) and high polyunsaturated and monounsaturated fats. Foods with whole grains, such as whole wheat, cracked wheat, brown rice, and wild rice. Whole grains that are fortified with folic acid. This is recommended for females who are pregnant or who want to  become pregnant. Read labels and do not eat or drink the following: Foods or drinks with added sugars. These include foods that contain brown sugar, corn sweetener, corn syrup, dextrose , fructose, glucose, high-fructose corn syrup, honey, invert sugar, lactose, malt syrup, maltose, molasses, raw sugar, sucrose, trehalose, or turbinado sugar. Limit your intake of added sugars to less than 10% of your total daily calories. Do not eat more than the following amounts of added sugar per day: 6 teaspoons (25 g) for females. 9 teaspoons (38 g) for males. Foods that contain processed or refined starches and grains. Refined grain products, such as white flour, degermed cornmeal, white bread, and white rice. Shopping Choose nutrient-rich snacks, such as vegetables, whole fruits, and nuts. Avoid high-calorie and high-sugar snacks, such as potato chips, fruit snacks, and candy. Use oil-based dressings and spreads on foods instead of solid fats such as butter, margarine, sour cream, or cream cheese. Limit pre-made sauces, mixes, and instant products such as flavored rice, instant noodles, and ready-made pasta. Try more plant-protein sources, such as tofu, tempeh, black beans, edamame, lentils, nuts, and seeds. Explore eating plans such as the Mediterranean diet or vegetarian diet. Try heart-healthy dips made with beans and healthy fats like hummus and guacamole. Vegetables go great with these. Cooking Use oil to saut or stir-fry foods instead of solid fats such as butter, margarine, or lard. Try baking, boiling, grilling, or broiling instead of frying. Remove the fatty part of meats before cooking. Steam vegetables in water  or broth. Meal planning  At meals, imagine dividing your plate into fourths: One-half of  your plate is fruits and vegetables. One-fourth of your plate is whole grains. One-fourth of your plate is protein, especially lean meats, poultry, eggs, tofu, beans, or nuts. Include low-fat  dairy as part of your daily diet. Lifestyle Choose healthy options in all settings, including home, work, school, restaurants, or stores. Prepare your food safely: Wash your hands after handling raw meats. Where you prepare food, keep surfaces clean by regularly washing with hot, soapy water . Keep raw meats separate from ready-to-eat foods, such as fruits and vegetables. Cook seafood, meat, poultry, and eggs to the recommended temperature. Get a food thermometer. Store foods at safe temperatures. In general: Keep cold foods at 84F (4.4C) or below. Keep hot foods at 184F (60C) or above. Keep your freezer at [facility] (-17.8C) or below. Foods are not safe to eat if they have been between the temperatures of 40-184F (4.4-60C) for more than 2 hours. What foods should I eat? Fruits Aim to eat 1-2 cups of fresh, canned (in natural juice), or frozen fruits each day. One cup of fruit equals 1 small apple, 1 large banana, 8 large strawberries, 1 cup (237 g) canned fruit,  cup (82 g) dried fruit, or 1 cup (240 mL) 100% juice. Vegetables Aim to eat 2-4 cups of fresh and frozen vegetables each day, including different varieties and colors. One cup of vegetables equals 1 cup (91 g) broccoli or cauliflower florets, 2 medium carrots, 2 cups (150 g) raw, leafy greens, 1 large tomato, 1 large bell pepper, 1 large sweet potato, or 1 medium white potato. Grains Aim to eat 5-10 ounce-equivalents of whole grains each day. Examples of 1 ounce-equivalent of grains include 1 slice of bread, 1 cup (40 g) ready-to-eat cereal, 3 cups (24 g) popcorn, or  cup (93 g) cooked rice. Meats and other proteins Try to eat 5-7 ounce-equivalents of protein each day. Examples of 1 ounce-equivalent of protein include 1 egg,  oz nuts (12 almonds, 24 pistachios, or 7 walnut halves), 1/4 cup (90 g) cooked beans, 6 tablespoons (90 g) hummus or 1 tablespoon (16 g) peanut butter. A cut of meat or fish that is the size of a deck of  cards is about 3-4 ounce-equivalents (85 g). Of the protein you eat each week, try to have at least 8 sounce (227 g) of seafood. This is about 2 servings per week. This includes salmon, trout, herring, sardines, and anchovies. Dairy Aim to eat 3 cup-equivalents of fat-free or low-fat dairy each day. Examples of 1 cup-equivalent of dairy include 1 cup (240 mL) milk, 8 ounces (250 g) yogurt, 1 ounces (44 g) natural cheese, or 1 cup (240 mL) fortified soy milk. Fats and oils Aim for about 5 teaspoons (21 g) of fats and oils per day. Choose monounsaturated fats, such as canola and olive oils, mayonnaise made with olive oil or avocado oil, avocados, peanut butter, and most nuts, or polyunsaturated fats, such as sunflower, corn, and soybean oils, walnuts, pine nuts, sesame seeds, sunflower seeds, and flaxseed. Beverages Aim for 6 eight-ounce glasses of water  per day. Limit coffee to 3-5 eight-ounce cups per day. Limit caffeinated beverages that have added calories, such as soda and energy drinks. If you drink alcohol: Limit how much you have to: 0-1 drink a day if you are male. 0-2 drinks a day if you are male. Know how much alcohol is in your drink. In the [place] one drink is one 12 oz bottle of beer (355 mL), one 5 oz glass of wine (  148 mL), or one 1 oz glass of hard liquor (44 mL). Seasoning and other foods Try not to add too much salt to your food. Try using herbs and spices instead of salt. Try not to add sugar to food. This information is based on [place] nutrition guidelines. To learn more, visit [employer]. Exact amounts may vary. You may need different amounts. This information is not intended to replace advice given to you by your health care provider. Make sure you discuss any questions you have with your health care provider. Document Revised: 08/09/2022 Document Reviewed: 08/09/2022 Elsevier Patient Education  2024 [employer].

## 2024-08-28 ENCOUNTER — Encounter: Payer: Self-pay | Admitting: Nurse Practitioner

## 2024-08-28 ENCOUNTER — Ambulatory Visit: Admitting: Nurse Practitioner

## 2024-08-28 VITALS — BP 122/86 | HR 82 | Temp 98.7°F | Ht 69.0 in | Wt 223.4 lb

## 2024-08-28 DIAGNOSIS — E7800 Pure hypercholesterolemia, unspecified: Secondary | ICD-10-CM

## 2024-08-28 DIAGNOSIS — R2241 Localized swelling, mass and lump, right lower limb: Secondary | ICD-10-CM | POA: Insufficient documentation

## 2024-08-28 DIAGNOSIS — F17220 Nicotine dependence, chewing tobacco, uncomplicated: Secondary | ICD-10-CM

## 2024-08-28 DIAGNOSIS — K219 Gastro-esophageal reflux disease without esophagitis: Secondary | ICD-10-CM

## 2024-08-28 DIAGNOSIS — Z0000 Encounter for general adult medical examination without abnormal findings: Secondary | ICD-10-CM

## 2024-08-28 DIAGNOSIS — Z789 Other specified health status: Secondary | ICD-10-CM

## 2024-08-28 DIAGNOSIS — Z23 Encounter for immunization: Secondary | ICD-10-CM

## 2024-08-28 DIAGNOSIS — Z1211 Encounter for screening for malignant neoplasm of colon: Secondary | ICD-10-CM

## 2024-08-28 NOTE — Assessment & Plan Note (Signed)
 Present for over one year and getting larger in size.  Non tender. Would benefit ortho visit.

## 2024-08-28 NOTE — Assessment & Plan Note (Signed)
 I have recommended complete cessation of tobacco use. I have discussed various options available for assistance with tobacco cessation including over the counter methods (Nicotine gum, patch and lozenges). We also discussed prescription options (Chantix, Nicotine Inhaler / Nasal Spray). The patient is not interested in pursuing any prescription tobacco cessation options at this time.

## 2024-08-28 NOTE — Assessment & Plan Note (Addendum)
 Noted past labs, recheck today and start medication as needed. Would benefit statin, but will recheck labs today and further discuss. The 10-year ASCVD risk score (Arnett DK, et al., 2019) is: 8.7%   Values used to calculate the score:     Age: 49 years     Clincally relevant sex: Male     Is Non-Hispanic African American: No     Diabetic: No     Tobacco smoker: Yes     Systolic Blood Pressure: 122 mmHg     Is BP treated: No     HDL Cholesterol: 30 mg/dL     Total Cholesterol: 185 mg/dL

## 2024-08-28 NOTE — Assessment & Plan Note (Signed)
 Chronic, stable with intermittent Prilosec.  Continue current medication regimen and adjust as needed. Risks of PPI use were discussed with patient including bone loss, C. Diff diarrhea, pneumonia, infections, CKD, electrolyte abnormalities. Verbalizes understanding and chooses to continue the medication.

## 2024-08-28 NOTE — Progress Notes (Signed)
 BP 122/86   Pulse 82   Temp 98.7 F (37.1 C) (Oral)   Ht 5' 9 (1.753 m)   Wt 223 lb 6 oz (101.3 kg)   SpO2 97%   BMI 32.99 kg/m    Subjective:    Patient ID: Rick Mendez, male    DOB: 1976/08/15, 48 y.o.   MRN: [id number]  HPI: Rick Mendez is a 48 y.o. male presenting on 08/28/2024 for comprehensive medical examination. Current medical complaints include:none  He currently lives with: wife Interim Problems from his last visit: no   Would like refills on Revatio .  Has knot on the side of right lower leg has gotten bigger since last check.  GERD Takes Omeprazole  on a needed.  Smoker, smokes 1 PPD.  Not interested in quitting, he is trying to cut back.  Started smoking in 20's.   GERD control status: stable Satisfied with current treatment? yes Heartburn frequency: once every few weeks Medication side effects: no  Medication compliance: stable Previous GERD medications: none Dysphagia: no Odynophagia:  no Hematemesis: no Blood in stool: no EGD: no     08/28/2024    8:17 AM 12/06/2022    2:34 PM 12/16/2020    8:49 AM 02/18/2020    3:48 PM 06/22/2018   11:00 AM  Depression screen PHQ 2/9  Decreased Interest 0 0 0 0 0  Down, Depressed, Hopeless 0 0 0 0 0  PHQ - 2 Score 0 0 0 0 0  Altered sleeping 1 0 0 0 0  Tired, decreased energy 2 3 0 0 1  Change in appetite 0 0 0 0 0  Feeling bad or failure about yourself  0 0 0 0 0  Trouble concentrating 0 0 0 0 0  Moving slowly or fidgety/restless 0 2 0 0 0  Suicidal thoughts 0 0 0 0 0  PHQ-9 Score 3 5 0 0 1  Difficult doing work/chores Not difficult at all Somewhat difficult  Not difficult at all       08/28/2024    8:17 AM 12/06/2022    2:34 PM 02/18/2020    3:48 PM  GAD 7 : Generalized Anxiety Score  Nervous, Anxious, on Edge 0 0 0  Control/stop worrying 0 0 0  Worry too much - different things 0 0 0  Trouble relaxing 0 0 0  Restless 0 1 0  Easily annoyed or irritable 0 0 0  Afraid - awful might happen 0 0 0   Total GAD 7 Score 0 1 0  Anxiety Difficulty Not difficult at all Not difficult at all Not difficult at all   Functional Status Survey: Is the patient deaf or have difficulty hearing?: No Does the patient have difficulty seeing, even when wearing glasses/contacts?: No Does the patient have difficulty concentrating, remembering, or making decisions?: No Does the patient have difficulty walking or climbing stairs?: No Does the patient have difficulty dressing or bathing?: No Does the patient have difficulty doing errands alone such as visiting a doctor's office or shopping?: No  FALL RISK:    12/06/2022    2:34 PM 12/16/2020    8:49 AM 02/18/2020    3:40 PM 06/22/2018   10:10 AM 10/19/2017    2:09 PM  Fall Risk   Falls in the past year? 0 0 0  No  No   Number falls in past yr: 0  0    Injury with Fall? 0  0    Risk for fall  due to : No Fall Risks      Follow up Falls evaluation completed   Falls evaluation completed        Data saved with a previous flowsheet row definition   Past Medical History:  Past Medical History:  Diagnosis Date   Concussion     Surgical History:  History reviewed. No pertinent surgical history.  Medications:  Current Outpatient Medications on File Prior to Visit  Medication Sig   albuterol  (VENTOLIN  HFA) 108 (90 Base) MCG/ACT inhaler Inhale 2 puffs into the lungs every 6 (six) hours as needed for wheezing or shortness of breath.   omeprazole  (PRILOSEC) 20 MG capsule Take 20 mg by mouth daily as needed (for heart burn).   sildenafil  (REVATIO ) 20 MG tablet TAKE 1-5 TABLETS AS NEEDED   terbinafine  (LAMISIL ) 250 MG tablet Take 1 tablet (250 mg total) by mouth daily. Stop in 12 weeks.   No current facility-administered medications on file prior to visit.    Allergies:  No Known Allergies  Social History:  Social History   Socioeconomic History   Marital status: Married    Spouse name: Not on file   Number of children: Not on file   Years of  education: Not on file   Highest education level: Not on file  Occupational History   Not on file  Tobacco Use   Smoking status: Every Day    Current packs/day: 1.50    Average packs/day: 1.5 packs/day for 19.0 years (28.5 ttl pk-yrs)    Types: Cigarettes   Smokeless tobacco: Never  Vaping Use   Vaping status: Never Used  Substance and Sexual Activity   Alcohol use: Yes    Comment: social drinker   Drug use: No   Sexual activity: Yes  Other Topics Concern   Not on file  Social History Narrative   Not on file   Social Drivers of Health   Financial Resource Strain: Not on file  Food Insecurity: Not on file  Transportation Needs: Not on file  Physical Activity: Not on file  Stress: Not on file  Social Connections: Not on file  Intimate Partner Violence: Not on file   Social History   Tobacco Use  Smoking Status Every Day   Current packs/day: 1.50   Average packs/day: 1.5 packs/day for 19.0 years (28.5 ttl pk-yrs)   Types: Cigarettes  Smokeless Tobacco Never   Social History   Substance and Sexual Activity  Alcohol Use Yes   Comment: social drinker    Family History:  Family History  Problem Relation Age of Onset   Diabetes Father    Mental illness Father    Dementia Maternal Grandmother    Heart disease Maternal Grandfather        MI   Cancer Paternal Grandfather        prostate    Past medical history, surgical history, medications, allergies, family history and social history reviewed with patient today and changes made to appropriate areas of the chart.   Review of Systems - negative All other ROS negative except what is listed above and in the HPI.      Objective:    BP 122/86   Pulse 82   Temp 98.7 F (37.1 C) (Oral)   Ht 5' 9 (1.753 m)   Wt 223 lb 6 oz (101.3 kg)   SpO2 97%   BMI 32.99 kg/m   Wt Readings from Last 3 Encounters:  08/28/24 223 lb 6 oz (101.3 kg)  12/06/22 215 lb 12.5 oz (97.9 kg)  12/16/20 214 lb (97.1 kg)     Physical Exam Vitals and nursing note reviewed.  Constitutional:      General: He is awake. He is not in acute distress.    Appearance: He is well-developed. He is not ill-appearing.  HENT:     Head: Normocephalic and atraumatic.     Right Ear: Hearing, tympanic membrane, ear canal and external ear normal. No drainage.     Left Ear: Hearing, tympanic membrane, ear canal and external ear normal. No drainage.     Nose: Nose normal.     Mouth/Throat:     Pharynx: Uvula midline.  Eyes:     General: Lids are normal.        Right eye: No discharge.        Left eye: No discharge.     Extraocular Movements: Extraocular movements intact.     Conjunctiva/sclera: Conjunctivae normal.     Pupils: Pupils are equal, round, and reactive to light.     Visual Fields: Right eye visual fields normal and left eye visual fields normal.  Neck:     Thyroid : No thyromegaly.     Vascular: No carotid bruit or JVD.     Trachea: Trachea normal.  Cardiovascular:     Rate and Rhythm: Normal rate and regular rhythm.     Heart sounds: Normal heart sounds, S1 normal and S2 normal. No murmur heard.    No gallop.  Pulmonary:     Effort: Pulmonary effort is normal. No accessory muscle usage or respiratory distress.     Breath sounds: Normal breath sounds.  Abdominal:     General: Bowel sounds are normal.     Palpations: Abdomen is soft. There is no hepatomegaly or splenomegaly.     Tenderness: There is no abdominal tenderness.  Genitourinary:    Comments: Deferred per request. Musculoskeletal:        General: Normal range of motion.     Cervical back: Normal range of motion and neck supple.     Right lower leg: No edema.     Left lower leg: No edema.  Lymphadenopathy:     Head:     Right side of head: No submental, submandibular, tonsillar, preauricular or posterior auricular adenopathy.     Left side of head: No submental, submandibular, tonsillar, preauricular or posterior auricular adenopathy.      Cervical: No cervical adenopathy.  Skin:    General: Skin is warm and dry.     Capillary Refill: Capillary refill takes less than 2 seconds.     Findings: No rash.      Neurological:     Mental Status: He is alert and oriented to person, place, and time.     Gait: Gait is intact.     Deep Tendon Reflexes: Reflexes are normal and symmetric.     Reflex Scores:      Brachioradialis reflexes are 2+ on the right side and 2+ on the left side.      Patellar reflexes are 2+ on the right side and 2+ on the left side. Psychiatric:        Attention and Perception: Attention normal.        Mood and Affect: Mood normal.        Speech: Speech normal.        Behavior: Behavior normal. Behavior is cooperative.        Thought Content: Thought content normal.  Cognition and Memory: Cognition normal.        Judgment: Judgment normal.     Results for orders placed or performed in visit on 12/06/22  CBC with Differential/Platelet   Collection Time: 12/06/22  2:38 PM  Result Value Ref Range   WBC 9.0 3.4 - 10.8 x10E3/uL   RBC 5.26 4.14 - 5.80 x10E6/uL   Hemoglobin 16.5 13.0 - 17.7 g/dL   Hematocrit 52.2 62.4 - 51.0 %   MCV 91 79 - 97 fL   MCH 31.4 26.6 - 33.0 pg   MCHC 34.6 31.5 - 35.7 g/dL   RDW 87.5 88.3 - 84.5 %   Platelets 219 150 - 450 x10E3/uL   Neutrophils 67 Not Estab. %   Lymphs 23 Not Estab. %   Monocytes 5 Not Estab. %   Eos 5 Not Estab. %   Basos 0 Not Estab. %   Neutrophils Absolute 6.0 1.4 - 7.0 x10E3/uL   Lymphocytes Absolute 2.1 0.7 - 3.1 x10E3/uL   Monocytes Absolute 0.5 0.1 - 0.9 x10E3/uL   EOS (ABSOLUTE) 0.4 0.0 - 0.4 x10E3/uL   Basophils Absolute 0.0 0.0 - 0.2 x10E3/uL   Immature Granulocytes 0 Not Estab. %   Immature Grans (Abs) 0.0 0.0 - 0.1 x10E3/uL  Comprehensive metabolic panel   Collection Time: 12/06/22  2:38 PM  Result Value Ref Range   Glucose 82 70 - 99 mg/dL   BUN 21 6 - 24 mg/dL   Creatinine, Ser 9.15 0.76 - 1.27 mg/dL   eGFR 889 >40 fO/fpw/8.26    BUN/Creatinine Ratio 25 (H) 9 - 20   Sodium 143 134 - 144 mmol/L   Potassium 4.2 3.5 - 5.2 mmol/L   Chloride 106 96 - 106 mmol/L   CO2 21 20 - 29 mmol/L   Calcium 10.3 (H) 8.7 - 10.2 mg/dL   Total Protein 6.9 6.0 - 8.5 g/dL   Albumin 4.5 4.1 - 5.1 g/dL   Globulin, Total 2.4 1.5 - 4.5 g/dL   Albumin/Globulin Ratio 1.9 1.2 - 2.2   Bilirubin Total 0.4 0.0 - 1.2 mg/dL   Alkaline Phosphatase 92 44 - 121 IU/L   AST 13 0 - 40 IU/L   ALT 17 0 - 44 IU/L  TSH   Collection Time: 12/06/22  2:38 PM  Result Value Ref Range   TSH 2.290 0.450 - 4.500 uIU/mL  Lipid Panel w/o Chol/HDL Ratio   Collection Time: 12/06/22  2:38 PM  Result Value Ref Range   Cholesterol, Total 185 100 - 199 mg/dL   Triglycerides 763 (H) 0 - 149 mg/dL   HDL 30 (L) >60 mg/dL   VLDL Cholesterol Cal 41 (H) 5 - 40 mg/dL   LDL Chol Calc (NIH) 885 (H) 0 - 99 mg/dL  Hepatitis C antibody   Collection Time: 12/06/22  2:38 PM  Result Value Ref Range   Hep C Virus Ab Non Reactive Non Reactive  Hemoglobin A1c   Collection Time: 12/06/22  2:38 PM  Result Value Ref Range   Hgb A1c MFr Bld 5.1 4.8 - 5.6 %   Est. average glucose Bld gHb Est-mCnc 100 mg/dL      Assessment & Plan:   Problem List Items Addressed This Visit       Digestive   GERD (gastroesophageal reflux disease)   Chronic, stable with intermittent Prilosec.  Continue current medication regimen and adjust as needed. Risks of PPI use were discussed with patient including bone loss, C. Diff diarrhea, pneumonia, infections, CKD, electrolyte  abnormalities. Verbalizes understanding and chooses to continue the medication.       Relevant Orders   Magnesium     Other   Nicotine dependence, chewing tobacco, uncomplicated   I have recommended complete cessation of tobacco use. I have discussed various options available for assistance with tobacco cessation including over the counter methods (Nicotine gum, patch and lozenges). We also discussed prescription options  (Chantix, Nicotine Inhaler / Nasal Spray). The patient is not interested in pursuing any prescription tobacco cessation options at this time.       Elevated low density lipoprotein (LDL) cholesterol level - Primary   Noted past labs, recheck today and start medication as needed. Would benefit statin, but will recheck labs today and further discuss. The 10-year ASCVD risk score (Arnett DK, et al., 2019) is: 8.7%   Values used to calculate the score:     Age: 25 years     Clincally relevant sex: Male     Is Non-Hispanic African American: No     Diabetic: No     Tobacco smoker: Yes     Systolic Blood Pressure: 122 mmHg     Is BP treated: No     HDL Cholesterol: 30 mg/dL     Total Cholesterol: 185 mg/dL       Relevant Orders   Comprehensive metabolic panel with GFR   Lipid Panel w/o Chol/HDL Ratio   Other Visit Diagnoses       Colon cancer screening       GI referral.   Relevant Orders   Ambulatory referral to Gastroenterology     Encounter for annual physical exam       Annual physical today with labs and health maintenance reviewed, discussed with patient.   Relevant Orders   CBC with Differential/Platelet   TSH        Discussed aspirin prophylaxis for myocardial infarction prevention and decision was it was not indicated  LABORATORY TESTING:  Health maintenance labs ordered today as discussed above.   No family history of prostate cancer and no symptoms.  IMMUNIZATIONS:   - Tdap: Tetanus vaccination status reviewed: refused - Influenza: Refused - Pneumovax: Not applicable - Prevnar: Not applicable - Zostavax vaccine: Not applicable  SCREENING: - Colonoscopy: will pursue next visit. Discussed with patient purpose of the colonoscopy is to detect colon cancer at curable precancerous or early stages   - AAA Screening: Not applicable  -Hearing Test: Not applicable  -Spirometry: Not applicable   PATIENT COUNSELING:    Sexuality: Discussed sexually transmitted  diseases, partner selection, use of condoms, avoidance of unintended pregnancy  and contraceptive alternatives.   Advised to avoid cigarette smoking.  I discussed with the patient that most people either abstain from alcohol or drink within safe limits (<=14/week and <=4 drinks/occasion for males, <=7/weeks and <= 3 drinks/occasion for females) and that the risk for alcohol disorders and other health effects rises proportionally with the number of drinks per week and how often a drinker exceeds daily limits.  Discussed cessation/primary prevention of drug use and availability of treatment for abuse.   Diet: Encouraged to adjust caloric intake to maintain  or achieve ideal body weight, to reduce intake of dietary saturated fat and total fat, to limit sodium intake by avoiding high sodium foods and not adding table salt, and to maintain adequate dietary potassium and calcium preferably from fresh fruits, vegetables, and low-fat dairy products.    Stressed the importance of regular exercise  Injury prevention: Discussed safety  belts, safety helmets, smoke detector, smoking near bedding or upholstery.   Dental health: Discussed importance of regular tooth brushing, flossing, and dental visits.   Follow up plan: NEXT PREVENTATIVE PHYSICAL DUE IN 1 YEAR. Return in about 1 year (around 08/28/2025) for Annual Physical.

## 2024-08-28 NOTE — Addendum Note (Signed)
 Addended by: Alphonzo Devera T on: 08/28/2024 09:03 AM   Modules accepted: Orders

## 2024-08-29 ENCOUNTER — Ambulatory Visit: Payer: Self-pay | Admitting: Nurse Practitioner

## 2024-08-29 LAB — COMPREHENSIVE METABOLIC PANEL WITH GFR
ALT: 25 IU/L (ref 0–44)
AST: 21 IU/L (ref 0–40)
Albumin: 4.5 g/dL (ref 4.1–5.1)
Alkaline Phosphatase: 103 IU/L (ref 47–123)
BUN/Creatinine Ratio: 22 — ABNORMAL HIGH (ref 9–20)
BUN: 21 mg/dL (ref 6–24)
Bilirubin Total: 0.5 mg/dL (ref 0.0–1.2)
CO2: 22 mmol/L (ref 20–29)
Calcium: 9.6 mg/dL (ref 8.7–10.2)
Chloride: 105 mmol/L (ref 96–106)
Creatinine, Ser: 0.94 mg/dL (ref 0.76–1.27)
Globulin, Total: 2.6 g/dL (ref 1.5–4.5)
Glucose: 87 mg/dL (ref 70–99)
Potassium: 4.4 mmol/L (ref 3.5–5.2)
Sodium: 140 mmol/L (ref 134–144)
Total Protein: 7.1 g/dL (ref 6.0–8.5)
eGFR: 101 mL/min/1.73 (ref >59)

## 2024-08-29 LAB — CBC WITH DIFFERENTIAL/PLATELET
Basophils Absolute: 0 x10E3/uL (ref 0.0–0.2)
Basos: 0 %
EOS (ABSOLUTE): 0.3 x10E3/uL (ref 0.0–0.4)
Eos: 5 %
Hematocrit: 48 % (ref 37.5–51.0)
Hemoglobin: 16.4 g/dL (ref 13.0–17.7)
Immature Grans (Abs): 0 x10E3/uL (ref 0.0–0.1)
Immature Granulocytes: 0 %
Lymphocytes Absolute: 1.4 x10E3/uL (ref 0.7–3.1)
Lymphs: 23 %
MCH: 31.8 pg (ref 26.6–33.0)
MCHC: 34.2 g/dL (ref 31.5–35.7)
MCV: 93 fL (ref 79–97)
Monocytes Absolute: 0.4 x10E3/uL (ref 0.1–0.9)
Monocytes: 7 %
Neutrophils Absolute: 3.8 x10E3/uL (ref 1.4–7.0)
Neutrophils: 65 %
Platelets: 217 x10E3/uL (ref 150–450)
RBC: 5.16 x10E6/uL (ref 4.14–5.80)
RDW: 12.4 % (ref 11.6–15.4)
WBC: 6 x10E3/uL (ref 3.4–10.8)

## 2024-08-29 LAB — LIPID PANEL W/O CHOL/HDL RATIO
Cholesterol, Total: 176 mg/dL (ref 100–199)
HDL: 32 mg/dL — ABNORMAL LOW (ref >39)
LDL Chol Calc (NIH): 130 mg/dL — ABNORMAL HIGH (ref 0–99)
Triglycerides: 71 mg/dL (ref 0–149)
VLDL Cholesterol Cal: 14 mg/dL (ref 5–40)

## 2024-08-29 LAB — TSH: TSH: 2.16 u[IU]/mL (ref 0.450–4.500)

## 2024-08-29 LAB — MAGNESIUM: Magnesium: 2 mg/dL (ref 1.6–2.3)

## 2024-08-29 NOTE — Progress Notes (Signed)
 Contacted via MyChart The 10-year ASCVD risk score (Arnett DK, et al., 2019) is: 7.4%   Values used to calculate the score:     Age: 48 years     Clincally relevant sex: Male     Is Non-Hispanic African American: No     Diabetic: No     Tobacco smoker: Yes     Systolic Blood Pressure: 122 mmHg     Is BP treated: No     HDL Cholesterol: 32 mg/dL     Total Cholesterol: 176 mg/dL  Good afternoon Rick Mendez, your labs have returned: - Kidney function, creatinine and eGFR, remains normal, as is liver function, AST and ALT.  - Lipid panel continues to show elevation in LDL, bad cholesterol, and you remain right on that cusp of where medication is recommended.  Over next year focus heavily on diet and regular exercise, to see if we can bring this down. Also try to cut back on chewing tobacco.  If ongoing elevations next year we will then discuss statin therapy. - Remainder of labs are stable.  Any questions? Keep being amazing!!  Thank you for allowing me to participate in your care.  I appreciate you. Kindest regards, Chenille Toor

## 2024-08-30 MED ORDER — TADALAFIL 20 MG PO TABS: 10.0000 mg | ORAL_TABLET | ORAL | 11 refills | Status: AC | PRN

## 2024-10-23 ENCOUNTER — Encounter: Admitting: Nurse Practitioner

## 2025-09-05 ENCOUNTER — Encounter: Admitting: Nurse Practitioner
